# Patient Record
Sex: Female | Born: 1935 | Race: Black or African American | Hispanic: No | Smoking: Never smoker
Health system: Southern US, Community
[De-identification: ages and names within clinical notes are randomized; demographics above are authoritative.]

## PROBLEM LIST (undated history)

## (undated) DIAGNOSIS — R634 Abnormal weight loss: Secondary | ICD-10-CM

## (undated) DIAGNOSIS — Z13220 Encounter for screening for lipoid disorders: Secondary | ICD-10-CM

## (undated) DIAGNOSIS — M810 Age-related osteoporosis without current pathological fracture: Secondary | ICD-10-CM

## (undated) DIAGNOSIS — F0390 Unspecified dementia without behavioral disturbance: Secondary | ICD-10-CM

## (undated) DIAGNOSIS — I10 Essential (primary) hypertension: Secondary | ICD-10-CM

## (undated) DIAGNOSIS — I679 Cerebrovascular disease, unspecified: Secondary | ICD-10-CM

## (undated) DIAGNOSIS — Z0000 Encounter for general adult medical examination without abnormal findings: Secondary | ICD-10-CM

## (undated) DIAGNOSIS — E079 Disorder of thyroid, unspecified: Secondary | ICD-10-CM

## (undated) DIAGNOSIS — K2100 Gastro-esophageal reflux disease with esophagitis, without bleeding: Secondary | ICD-10-CM

## (undated) DIAGNOSIS — K649 Unspecified hemorrhoids: Secondary | ICD-10-CM

## (undated) DIAGNOSIS — G309 Alzheimer's disease, unspecified: Secondary | ICD-10-CM

## (undated) DIAGNOSIS — R413 Other amnesia: Secondary | ICD-10-CM

## (undated) DIAGNOSIS — C73 Malignant neoplasm of thyroid gland: Secondary | ICD-10-CM

## (undated) DIAGNOSIS — D126 Benign neoplasm of colon, unspecified: Secondary | ICD-10-CM

## (undated) DIAGNOSIS — E039 Hypothyroidism, unspecified: Secondary | ICD-10-CM

## (undated) DIAGNOSIS — N3941 Urge incontinence: Secondary | ICD-10-CM

## (undated) DIAGNOSIS — K5900 Constipation, unspecified: Secondary | ICD-10-CM

## (undated) DIAGNOSIS — E46 Unspecified protein-calorie malnutrition: Secondary | ICD-10-CM

## (undated) DIAGNOSIS — F0280 Dementia in other diseases classified elsewhere without behavioral disturbance: Secondary | ICD-10-CM

## (undated) DIAGNOSIS — H269 Unspecified cataract: Secondary | ICD-10-CM

## (undated) DIAGNOSIS — H409 Unspecified glaucoma: Secondary | ICD-10-CM

## (undated) HISTORY — DX: Unspecified cataract: H26.9

## (undated) HISTORY — PX: OTHER SURGICAL HISTORY: SHX169

## (undated) HISTORY — DX: Abnormal weight loss: R63.4

## (undated) HISTORY — DX: Encounter for general adult medical examination without abnormal findings: Z00.00

## (undated) HISTORY — DX: Unspecified glaucoma: H40.9

## (undated) HISTORY — DX: Other amnesia: R41.3

## (undated) HISTORY — DX: Essential (primary) hypertension: I10

## (undated) HISTORY — DX: Cerebrovascular disease, unspecified: I67.9

## (undated) HISTORY — DX: Hypothyroidism, unspecified: E03.9

## (undated) HISTORY — DX: Unspecified hemorrhoids: K64.9

## (undated) HISTORY — DX: Constipation, unspecified: K59.00

## (undated) HISTORY — DX: Age-related osteoporosis without current pathological fracture: M81.0

## (undated) HISTORY — PX: THYROIDECTOMY: SHX17

## (undated) HISTORY — DX: Malignant neoplasm of thyroid gland: C73

## (undated) HISTORY — DX: Dementia in other diseases classified elsewhere, unspecified severity, without behavioral disturbance, psychotic disturbance, mood disturbance, and anxiety: F02.80

## (undated) HISTORY — PX: EYE SURGERY: SHX253

## (undated) HISTORY — DX: Benign neoplasm of colon, unspecified: D12.6

## (undated) HISTORY — DX: Gastro-esophageal reflux disease with esophagitis, without bleeding: K21.00

## (undated) HISTORY — DX: Encounter for screening for lipoid disorders: Z13.220

## (undated) HISTORY — DX: Urge incontinence: N39.41

## (undated) HISTORY — DX: Unspecified protein-calorie malnutrition: E46

## (undated) HISTORY — DX: Gastro-esophageal reflux disease with esophagitis: K21.0

## (undated) HISTORY — PX: NO PAST SURGERIES: SHX2092

## (undated) HISTORY — DX: Alzheimer's disease, unspecified: G30.9

---

## 1997-08-19 ENCOUNTER — Other Ambulatory Visit: Admission: RE | Admit: 1997-08-19 | Discharge: 1997-08-19 | Payer: Self-pay | Admitting: Family Medicine

## 1999-02-25 ENCOUNTER — Encounter: Payer: Self-pay | Admitting: Gastroenterology

## 1999-02-25 ENCOUNTER — Ambulatory Visit (HOSPITAL_COMMUNITY): Admission: RE | Admit: 1999-02-25 | Discharge: 1999-02-25 | Payer: Self-pay | Admitting: Gastroenterology

## 1999-05-12 ENCOUNTER — Other Ambulatory Visit: Admission: RE | Admit: 1999-05-12 | Discharge: 1999-05-12 | Payer: Self-pay | Admitting: Obstetrics and Gynecology

## 2005-02-07 ENCOUNTER — Emergency Department (HOSPITAL_COMMUNITY): Admission: EM | Admit: 2005-02-07 | Discharge: 2005-02-08 | Payer: Self-pay | Admitting: Emergency Medicine

## 2010-11-16 ENCOUNTER — Other Ambulatory Visit: Payer: Self-pay | Admitting: Internal Medicine

## 2010-11-16 DIAGNOSIS — Z780 Asymptomatic menopausal state: Secondary | ICD-10-CM

## 2010-11-16 DIAGNOSIS — Z1231 Encounter for screening mammogram for malignant neoplasm of breast: Secondary | ICD-10-CM

## 2010-11-17 ENCOUNTER — Ambulatory Visit
Admission: RE | Admit: 2010-11-17 | Discharge: 2010-11-17 | Disposition: A | Discharge status: Home / Self Care | Payer: Medicare Other | Source: Ambulatory Visit | Attending: Internal Medicine | Admitting: Internal Medicine

## 2010-11-17 DIAGNOSIS — Z1231 Encounter for screening mammogram for malignant neoplasm of breast: Secondary | ICD-10-CM

## 2010-11-17 DIAGNOSIS — Z780 Asymptomatic menopausal state: Secondary | ICD-10-CM

## 2011-09-24 ENCOUNTER — Observation Stay (HOSPITAL_COMMUNITY)
Admission: EM | Admit: 2011-09-24 | Discharge: 2011-09-26 | Disposition: A | Discharge status: Home / Self Care | Payer: Medicare Other | Attending: Internal Medicine | Admitting: Internal Medicine

## 2011-09-24 ENCOUNTER — Encounter (HOSPITAL_COMMUNITY): Payer: Self-pay | Admitting: Emergency Medicine

## 2011-09-24 DIAGNOSIS — F0390 Unspecified dementia without behavioral disturbance: Secondary | ICD-10-CM | POA: Diagnosis present

## 2011-09-24 DIAGNOSIS — R1032 Left lower quadrant pain: Secondary | ICD-10-CM | POA: Diagnosis present

## 2011-09-24 DIAGNOSIS — K649 Unspecified hemorrhoids: Secondary | ICD-10-CM | POA: Insufficient documentation

## 2011-09-24 DIAGNOSIS — K922 Gastrointestinal hemorrhage, unspecified: Principal | ICD-10-CM | POA: Diagnosis present

## 2011-09-24 DIAGNOSIS — E785 Hyperlipidemia, unspecified: Secondary | ICD-10-CM | POA: Diagnosis present

## 2011-09-24 DIAGNOSIS — D62 Acute posthemorrhagic anemia: Secondary | ICD-10-CM | POA: Insufficient documentation

## 2011-09-24 DIAGNOSIS — K5730 Diverticulosis of large intestine without perforation or abscess without bleeding: Secondary | ICD-10-CM | POA: Insufficient documentation

## 2011-09-24 HISTORY — DX: Essential (primary) hypertension: I10

## 2011-09-24 HISTORY — DX: Unspecified dementia, unspecified severity, without behavioral disturbance, psychotic disturbance, mood disturbance, and anxiety: F03.90

## 2011-09-24 HISTORY — DX: Disorder of thyroid, unspecified: E07.9

## 2011-09-24 NOTE — ED Notes (Signed)
Pt states this morning early after going to bathroom noted water to be real dark blood and blood clots in water. [place] daughter took to urgent care 2  Hours ago who would not see her. Pt states she has had 3 more bloody, a brighter color of blood. Pt denies hx of rectal bleeding of this nature. Hx of hemoroids w/ blood on tissue only but states this is different. VS stable, pt's color appears pale and washed out.

## 2011-09-25 ENCOUNTER — Inpatient Hospital Stay (HOSPITAL_COMMUNITY): Payer: Medicare Other

## 2011-09-25 ENCOUNTER — Encounter (HOSPITAL_COMMUNITY): Payer: Self-pay | Admitting: *Deleted

## 2011-09-25 DIAGNOSIS — R1032 Left lower quadrant pain: Secondary | ICD-10-CM | POA: Diagnosis present

## 2011-09-25 DIAGNOSIS — K922 Gastrointestinal hemorrhage, unspecified: Secondary | ICD-10-CM | POA: Diagnosis present

## 2011-09-25 DIAGNOSIS — E785 Hyperlipidemia, unspecified: Secondary | ICD-10-CM | POA: Diagnosis present

## 2011-09-25 DIAGNOSIS — F0390 Unspecified dementia without behavioral disturbance: Secondary | ICD-10-CM | POA: Diagnosis present

## 2011-09-25 LAB — CBC WITH DIFFERENTIAL/PLATELET
Basophils Absolute: 0 10*3/uL (ref 0.0–0.1)
HCT: 33.7 % — ABNORMAL LOW (ref 36.0–46.0)
Hemoglobin: 11.3 g/dL — ABNORMAL LOW (ref 12.0–15.0)
Lymphocytes Relative: 25 % (ref 12–46)
Lymphs Abs: 1.1 10*3/uL (ref 0.7–4.0)
Monocytes Relative: 9 % (ref 3–12)
Monocytes Relative: 10 % (ref 3–12)
Neutro Abs: 3.5 10*3/uL (ref 1.7–7.7)
Neutrophils Relative %: 64 % (ref 43–77)
Neutrophils Relative %: 67 % (ref 43–77)
Platelets: 165 10*3/uL (ref 150–400)
RBC: 3.84 MIL/uL — ABNORMAL LOW (ref 3.87–5.11)
RDW: 13.3 % (ref 11.5–15.5)
WBC: 5.5 10*3/uL (ref 4.0–10.5)
WBC: 5.2 10*3/uL (ref 4.0–10.5)

## 2011-09-25 LAB — COMPREHENSIVE METABOLIC PANEL
ALT: 6 U/L (ref 0–35)
Albumin: 3 g/dL — ABNORMAL LOW (ref 3.5–5.2)
Alkaline Phosphatase: 66 U/L (ref 39–117)
BUN: 20 mg/dL (ref 6–23)
Chloride: 104 mEq/L (ref 96–112)
GFR calc Af Amer: 57 mL/min — ABNORMAL LOW (ref >90)
Glucose, Bld: 66 mg/dL — ABNORMAL LOW (ref 70–99)
Potassium: 3.5 mEq/L (ref 3.5–5.1)
Sodium: 142 mEq/L (ref 135–145)
Total Bilirubin: 0.6 mg/dL (ref 0.3–1.2)

## 2011-09-25 LAB — HEMOGLOBIN AND HEMATOCRIT, BLOOD: HCT: 35.9 % — ABNORMAL LOW (ref 36.0–46.0)

## 2011-09-25 LAB — ABO/RH: ABO/RH(D): O POS

## 2011-09-25 LAB — MAGNESIUM: Magnesium: 1.9 mg/dL (ref 1.5–2.5)

## 2011-09-25 LAB — TYPE AND SCREEN: ABO/RH(D): O POS

## 2011-09-25 MED ORDER — ZOLPIDEM TARTRATE 5 MG PO TABS: 5.0000 mg | ORAL_TABLET | Freq: Every evening | ORAL | Status: DC | PRN

## 2011-09-25 MED ORDER — ACETAMINOPHEN 650 MG RE SUPP: 650.0000 mg | Freq: Four times a day (QID) | RECTAL | Status: DC | PRN

## 2011-09-25 MED ORDER — SODIUM CHLORIDE 0.9 % IV SOLN: 250.0000 mL | INTRAVENOUS | Status: DC | PRN

## 2011-09-25 MED ORDER — LEVOTHYROXINE SODIUM 100 MCG PO TABS
100.0000 ug | ORAL_TABLET | Freq: Every day | ORAL | Status: DC
  Administered 2011-09-25 – 2011-09-26 (×2): 100 ug via ORAL
  Filled 2011-09-25 (×3): qty 1

## 2011-09-25 MED ORDER — ONDANSETRON HCL 4 MG/2ML IJ SOLN: 4.0000 mg | Freq: Four times a day (QID) | INTRAMUSCULAR | Status: DC | PRN

## 2011-09-25 MED ORDER — SODIUM CHLORIDE 0.9 % IJ SOLN: 3.0000 mL | Freq: Two times a day (BID) | INTRAMUSCULAR | Status: DC

## 2011-09-25 MED ORDER — ACETAMINOPHEN 325 MG PO TABS: 650.0000 mg | ORAL_TABLET | Freq: Four times a day (QID) | ORAL | Status: DC | PRN

## 2011-09-25 MED ORDER — SODIUM CHLORIDE 0.9 % IJ SOLN: 3.0000 mL | INTRAMUSCULAR | Status: DC | PRN

## 2011-09-25 MED ORDER — SODIUM CHLORIDE 0.9 % IV SOLN
INTRAVENOUS | Status: AC
  Administered 2011-09-25: 05:00:00 via INTRAVENOUS

## 2011-09-25 MED ORDER — ONDANSETRON HCL 4 MG PO TABS: 4.0000 mg | ORAL_TABLET | Freq: Four times a day (QID) | ORAL | Status: DC | PRN

## 2011-09-25 NOTE — Consult Note (Signed)
Subjective:   HPI  The patient is a 78 year old female who we are asked to see in consultation in regards to rectal bleeding. She has noticed some intermittent rectal bleeding over the last week. She has not seen any today. She has remained hemodynamically stable. She describes the blood as bright red. She has a history of hemorrhoids. She had a colonoscopy 6 months ago by Dr. Ferdinand Lango in [place] [facility] and was told then she had hemorrhoids and never had to have another colonoscopy. I asked the patient and family if she ever had been told that she had diverticulosis and they did not recall this. She is not complaining of abdominal pain. She denies diarrhea. She had a CT scan of the abdomen and pelvis last night which shows numerous diverticula in the colon. There are no other findings in the colon to explain bleeding. There is also free fluid noted in the abdomen on the CT of uncertain etiology.  Review of Systems Denies chest pain or shortness of breath.  Past Medical History  Diagnosis Date  . Hypertension   . Thyroid disease    Past Surgical History  Procedure Date  . Thyroidectomy    History   Social History  . Marital Status: Widowed    Spouse Name: N/A    Number of Children: N/A  . Years of Education: N/A   Occupational History  . Not on file.   Social History Main Topics  . Smoking status: Never Smoker   . Smokeless tobacco: Not on file  . Alcohol Use: No  . Drug Use: No  . Sexually Active:    Other Topics Concern  . Not on file   Social History Narrative  . No narrative on file   family history is not on file. Current facility-administered medications:0.9 %  sodium chloride infusion, , Intravenous, Continuous, Bynum Bellows, MD, Last Rate: 75 mL/hr at 09/25/11 0458;  0.9 %  sodium chloride infusion, 250 mL, Intravenous, PRN, Simbiso Ranga, MD;  acetaminophen (TYLENOL) tablet 650 mg, 650 mg, Oral, Q6H PRN, Simbiso Ranga, MD;  levothyroxine (SYNTHROID,  LEVOTHROID) tablet 100 mcg, 100 mcg, Oral, Daily, Debbe Odea, MD, 100 mcg at 09/25/11 1120 ondansetron (ZOFRAN) injection 4 mg, 4 mg, Intravenous, Q6H PRN, Simbiso Ranga, MD;  ondansetron (ZOFRAN) tablet 4 mg, 4 mg, Oral, Q6H PRN, Simbiso Ranga, MD;  sodium chloride 0.9 % injection 3 mL, 3 mL, Intravenous, Q12H, Simbiso Ranga, MD;  sodium chloride 0.9 % injection 3 mL, 3 mL, Intravenous, PRN, Simbiso Ranga, MD;  zolpidem (AMBIEN) tablet 5 mg, 5 mg, Oral, QHS PRN, Nat Math, MD DISCONTD: acetaminophen (TYLENOL) suppository 650 mg, 650 mg, Rectal, Q6H PRN, Simbiso Ranga, MD No Known Allergies   Objective:     BP 124/49  Pulse 63  Temp 98.4 F (36.9 C) (Oral)  Resp 18  Ht 5' (1.524 m)  Wt 57.153 kg (126 lb)  BMI 24.61 kg/m2  SpO2 100%  She is in no distress.  Heart regular rhythm.  Lungs clear  Abdomen: Bowel sounds normal, soft, nontender  Laboratory No components found with this basename: d1      Assessment:     #1 rectal bleeding. This sounds to be of small volume. It could be hemorrhoidal or diverticular in nature. She is hemodynamically stable.  #2 free fluid in the abdominal pelvic area of uncertain etiology      Plan:     Obtain report of her recent colonoscopy. I'm not sure that we  need to any further diagnostic studies in regards to the colon as long as she remains hemodynamically stable. We will continue to monitor her clinically Lab Results  Component Value Date   HGB 11.3* 09/25/2011   HGB 11.0* 09/24/2011   HCT 35.0* 09/25/2011   HCT 33.7* 09/24/2011   ALKPHOS 66 09/25/2011   AST 17 09/25/2011   ALT 6 09/25/2011

## 2011-09-25 NOTE — Progress Notes (Signed)
[facility]   PT admitted this AM with c/o bright red blood per rectum. She has a h/o hemorrhoids but this bleed was pure blood and not mixed with stools. Her daughter states that she looked at the patient's depends prior to bringing her to the hospital and it did not seem like a significant amount of blood. Pt insists that is was a lot of blood.  No abd pain. No dizziness. Last colonoscopy less than 1 yr ago by Dr Ferdinand Lango at [facility]. She has had colonoscopies every 5 yrs and was told that this would be her last. No h/o malignant polyps being removed or colon cancer in the family.  Hgb stable- cont to check Q12 for now.  consulted with Dr Penelope Coop who will evaluate her.   Debbe Odea, MD [phone]

## 2011-09-25 NOTE — Progress Notes (Signed)
Patient resting in bed, no complaints of pain.  Offered beverages but declined states has ordered dinner.

## 2011-09-26 ENCOUNTER — Encounter (HOSPITAL_COMMUNITY): Payer: Self-pay | Admitting: *Deleted

## 2011-09-26 ENCOUNTER — Inpatient Hospital Stay (HOSPITAL_COMMUNITY): Payer: Medicare Other

## 2011-09-26 LAB — BASIC METABOLIC PANEL
CO2: 27 mEq/L (ref 19–32)
Calcium: 8.5 mg/dL (ref 8.4–10.5)
Creatinine, Ser: 0.99 mg/dL (ref 0.50–1.10)
GFR calc Af Amer: 56 mL/min — ABNORMAL LOW (ref >90)
Sodium: 144 mEq/L (ref 135–145)

## 2011-09-26 LAB — HEMOGLOBIN AND HEMATOCRIT, BLOOD
HCT: 32.3 % — ABNORMAL LOW (ref 36.0–46.0)
Hemoglobin: 10.3 g/dL — ABNORMAL LOW (ref 12.0–15.0)

## 2011-09-26 MED ORDER — DOCUSATE SODIUM 100 MG PO CAPS: 100.0000 mg | ORAL_CAPSULE | Freq: Two times a day (BID) | ORAL | Status: AC

## 2011-09-26 NOTE — H&P (Addendum)
[facility] History and Physical  Jill Gutierrez [id number] DOB: 09/26/1917 DOA: 09/24/2011  Referring physician: ER physician PCP: No primary provider on file.   Chief Complaint: bright red blood per rectum  HPI:  78 year old female with past medical history of dementia and  Dyslipidemia who presented to ED with complaints of bright red blood per rectum started morning of the admission. Patient reports she has noticed profound amount of blood with every bowel movement. She also reported that she does have previous history of similar event and per her recollection it was due to hemorrhoids. Patient reports associated left lower quadrant abdominal pain, 5/10 in intensity and non radiating. She has not taken anything for pain relief. No associated diarrhea and no blood in urine.No fever or chills.  Assessment and Plan:  Principal Problem: *Lower GI bleed  unclear etiology at this time  Please note that due to [facility] system downtime labs and imaging studies were not available  Were awaiting CBC and CT abdomen/pelvis  Active problems: Acute blood loss anemia  CBC was not available at the time of admission but due to reported BRBPR patient was admitted for further observation  CBC then became available with hemoglobin at 11 and patient did not require transfusion at the time of admission  Left lower quadrant abdominal pain  CT abdomen /pelvis was again unavailable at the time of admission but the findings now reported below  Code Status: Full Family Communication: Pt and family updated at bedside Disposition Plan: Admit for further evaluation  Leisa Lenz, MD  [facility] Pager [phone]  If 7PM-7AM, please contact night-coverage [email or web address] Password [email or web address] 09/26/2011, 8:50 PM   Review of Systems:  Constitutional: Negative for fever, chills and malaise/fatigue. Negative for diaphoresis.  HENT: Negative for hearing loss, ear pain, nosebleeds,  congestion, sore throat, neck pain, tinnitus and ear discharge.   Eyes: Negative for blurred vision, double vision, photophobia, pain, discharge and redness.  Respiratory: Negative for cough, hemoptysis, sputum production, shortness of breath, wheezing and stridor.   Cardiovascular: Negative for chest pain, palpitations, orthopnea, claudication and leg swelling.  Gastrointestinal: per GI Genitourinary: Negative for dysuria, urgency, frequency, hematuria and flank pain.  Musculoskeletal: Negative for myalgias, back pain, joint pain and falls.  Skin: Negative for itching and rash.  Neurological: Negative for dizziness and weakness. Negative for tingling, tremors, sensory change, speech change, focal weakness, loss of consciousness and headaches.  Endo/Heme/Allergies: Negative for environmental allergies and polydipsia. Does not bruise/bleed easily.  Psychiatric/Behavioral: Negative for suicidal ideas. The patient is not nervous/anxious.      Past Medical History  Diagnosis Date  . Hypertension   . Thyroid disease   . Dementia    Past Surgical History  Procedure Date  . Thyroidectomy   . No past surgeries    Social History:  reports that she has never smoked. She does not have any smokeless tobacco history on file. She reports that she does not drink alcohol or use illicit drugs.  No Known Allergies  Family History: no history of cancers, no cardiovascular diseases on mother or father side  Prior to Admission medications   Medication Sig Start Date End Date Taking? Authorizing Provider  cholecalciferol (VITAMIN D) 1000 UNITS tablet Take 1,000 Units by mouth daily.   Yes Historical Provider, MD  hydrochlorothiazide (HYDRODIURIL) 25 MG tablet Take 25 mg by mouth daily.   Yes Historical Provider, MD  levothyroxine (SYNTHROID, LEVOTHROID) 100 MCG tablet Take 100 mcg by mouth daily.  Yes Historical Provider, MD  lovastatin (MEVACOR) 20 MG tablet Take 20 mg by mouth at bedtime.   Yes  Historical Provider, MD  potassium chloride (KLOR-CON) 8 MEQ tablet Take 8 mEq by mouth 2 (two) times daily.   Yes Historical Provider, MD  docusate sodium (COLACE) 100 MG capsule Take 1 capsule (100 mg total) by mouth 2 (two) times daily. 09/26/11 10/06/11  Nat Math, MD   Physical Exam: Filed Vitals:   09/25/11 1107 09/25/11 1345 09/25/11 2100 09/26/11 0736  BP: 124/49 116/81 104/61 131/63  Pulse: 63 56 57 54  Temp:  98 F (36.7 C) 97.8 F (36.6 C) 97.2 F (36.2 C)  TempSrc:  Oral Oral Oral  Resp:  18 18 18   Height:      Weight:      SpO2:  100% 98% 100%    Physical Exam  Constitutional: Appears well-developed and well-nourished. No distress.  HENT: Normocephalic. External right and left ear normal. Oropharynx is clear and moist.  Eyes: Conjunctivae and EOM are normal. PERRLA, no scleral icterus.  Neck: Normal ROM. Neck supple. No JVD. No tracheal deviation. No thyromegaly.  CVS: RRR, S1/S2 +, no murmurs, no gallops, no carotid bruit.  Pulmonary: Effort and breath sounds normal, no stridor, rhonchi, wheezes, rales.  Abdominal: Soft. BS +,  no distension, tenderness, rebound or guarding.  Musculoskeletal: Normal range of motion. No edema and no tenderness.  Lymphadenopathy: No lymphadenopathy noted, cervical, inguinal. Neuro: Alert. Normal reflexes, muscle tone coordination. No cranial nerve deficit. Skin: Skin is warm and dry. No rash noted. Not diaphoretic. No erythema. No pallor.  Psychiatric: Normal mood and affect. Behavior, judgment, thought content normal.   Labs on Admission:  Basic Metabolic Panel:  Lab [id number] 09/25/11 0514  NA 144 142  K 3.5 3.5  CL 108 104  CO2 27 27  GLUCOSE 99 66*  BUN 16 20  CREATININE 0.99 0.97  CALCIUM 8.5 9.1  MG -- 1.9  PHOS -- 3.1   Liver Function Tests:  Lab 09/25/11 0514  AST 17  ALT 6  ALKPHOS 66  BILITOT 0.6  PROT 6.3  ALBUMIN 3.0*   CBC:  Lab 09/26/11 0335 09/25/11 1530 09/25/11 0514 09/24/11 2028  WBC --  -- 5.2 5.5  NEUTROABS -- -- 3.5 3.5  HGB 10.3* 11.7* 11.3* 11.0*  HCT 32.3* 35.9* 35.0* 33.7*  MCV -- -- 91.1 91.6  PLT -- -- 129* 165   CBG:  Lab 09/26/11 0739 09/25/11 0725  GLUCAP 121* 54*    Radiological Exams on Admission: Ct Abdomen Pelvis Wo Contrast  09/25/2011  *RADIOLOGY REPORT*  Clinical Data: Left lower quadrant pain  CT ABDOMEN AND PELVIS WITHOUT CONTRAST  Technique:  Multidetector CT imaging of the abdomen and pelvis was performed following the standard protocol without intravenous contrast.  Comparison: None.  Findings: Patchy density is partially visualized in the left base. No pericardial or pleural effusion.  Cyst is noted within the dome of the liver.  There is a calcified granuloma in the right hepatic lobe.  Prior cholecystectomy.  No biliary dilatation.  The pancreas is unremarkable.  Normal appearance of the spleen.  Both adrenal glands are unremarkable.  Normal appearance of the kidneys.  No hydronephrosis or nephrolithiasis.  There is mild wall thickening involving the urinary bladder.  There is a small soft tissue attenuating nodule at the bladder base measuring 0.7 cm, image 69. The uterus and the adnexal structures have a normal physiologic appearance for patient's age.  A small amount of free fluid is present within the dependent portion of the pelvis.  No enlarged upper abdominal or pelvic lymph nodes.  No inguinal adenopathy.  The stomach and the small bowel loops have a normal course and caliber without evidence for bowel obstruction.  There is extensive diverticulosis involving the sigmoid colon.  No acute inflammation.  Advanced calcified atherosclerotic disease affects the abdominal aorta and its branches.  Review of the visualized osseous structures is significant for mild multilevel spondylosis.  No acute findings identified.  IMPRESSION:  1.  Patchy density in the left base may represent early or resolving pneumonia.  Consider further evaluation with chest  radiograph. 2.  Small amount of free fluid within the dependent portion of the pelvis is of uncertain etiology. 3.  Small soft tissue attenuating nodule at the bladder base measures 7 mm.  Nonspecific.  Cannot rule out small neoplasm.  Original Report Authenticated By: Angelita Ingles, M.D.   Dg Chest Port 1 View  09/26/2011  *RADIOLOGY REPORT*  Clinical Data: Rule out pneumonia  PORTABLE CHEST - 1 VIEW  Comparison: None.  Findings: Lungs are clear.  Negative for pneumonia.  Negative for heart failure or effusion.  Degenerative changes in the shoulder joint and AC joint bilaterally.  IMPRESSION: No active cardiopulmonary disease.  Original Report Authenticated By: Truett Perna, M.D.    EKG: Normal sinus rhythm, no ST/T wave changes    Time spent: 75 minutes

## 2011-09-26 NOTE — Discharge Summary (Signed)
DISCHARGE SUMMARY  Jill Gutierrez  MR#: [id number]  DOB:Mar 16, 1917  Date of Admission: 09/24/2011 Date of Discharge: 09/26/2011  Attending Physician:Wei Newbrough  Patient's PCP:No primary provider on file.  Consults: [place], [place],MD  Discharge Diagnoses: Present on Admission:  .Lower GI bleed diverticular versus hemorrhoidal. .Abdominal pain, left lower quadrant .Dementia .Dyslipidemia   Hospital Course: Jill Gutierrez was admitted with LLQ pain , and rectal bleeding. The bleeding was self limiting and felt to be due to diverticular bleed. She was seen by gi, who opined no endoscopy. A ct abdomen/pelvis suggested some non specific fluid in the pelvis. Jill Gutierrez is hemodynamically stable, and will d/c home to the care of Jill Gutierrez family today. i have encouraged Jill Gutierrez to avoid aspirin for now.   Medication List  As of 09/26/2011  2:28 PM   TAKE these medications         cholecalciferol 1000 UNITS tablet   Commonly known as: VITAMIN D   Take 1,000 Units by mouth daily.      docusate sodium 100 MG capsule   Commonly known as: COLACE   Take 1 capsule (100 mg total) by mouth 2 (two) times daily.      hydrochlorothiazide 25 MG tablet   Commonly known as: HYDRODIURIL   Take 25 mg by mouth daily.      levothyroxine 100 MCG tablet   Commonly known as: SYNTHROID, LEVOTHROID   Take 100 mcg by mouth daily.      lovastatin 20 MG tablet   Commonly known as: MEVACOR   Take 20 mg by mouth at bedtime.      potassium chloride 8 MEQ tablet   Commonly known as: KLOR-CON   Take 8 mEq by mouth 2 (two) times daily.             Day of Discharge BP 131/63  Pulse 54  Temp 97.2 F (36.2 C) (Oral)  Resp 18  Ht 5' (1.524 m)  Wt 57.153 kg (126 lb)  BMI 24.61 kg/m2  SpO2 100%  Physical Exam: At baseline.  Results for orders placed during the hospital encounter of 09/24/11 (from the past 24 hour(s))  HEMOGLOBIN AND HEMATOCRIT, BLOOD     Status: Abnormal   Collection Time   09/25/11  3:30 PM      Component Value Range   Hemoglobin 11.7 (*) 12.0 - 15.0 g/dL   HCT 35.9 (*) 36.0 - 46.0 %  HEMOGLOBIN AND HEMATOCRIT, BLOOD     Status: Abnormal   Collection Time   09/26/11  3:35 AM      Component Value Range   Hemoglobin 10.3 (*) 12.0 - 15.0 g/dL   HCT 32.3 (*) 36.0 - [id number] %  BASIC METABOLIC PANEL     Status: Abnormal   Collection Time   09/26/11  3:35 AM      Component Value Range   Sodium 144  135 - 145 mEq/L   Potassium 3.5  3.5 - 5.1 mEq/L   Chloride 108  96 - 112 mEq/L   CO2 27  19 - 32 mEq/L   Glucose, Bld 99  70 - 99 mg/dL   BUN 16  6 - 23 mg/dL   Creatinine, Ser 0.99  0.50 - 1.10 mg/dL   Calcium 8.5  8.4 - 10.5 mg/dL   GFR calc non Af Amer 48 (*) >90 mL/min   GFR calc Af Amer 56 (*) >90 mL/min  GLUCOSE, CAPILLARY     Status: Abnormal   Collection Time   09/26/11  7:39 AM      Component Value Range   Glucose-Capillary 121 (*) 70 - 99 mg/dL   Comment 1 Notify RN      Disposition: home today.   Follow-up Appts: Discharge Orders    Future Orders Please Complete By Expires   Diet general      Increase activity slowly           Time spent in discharge (includes decision making & examination of pt): 20 minutes  Signed: Helma Argyle 09/26/2011, 2:28 PM

## 2011-09-26 NOTE — Progress Notes (Signed)
Pt was discharged and granddaughter took her home. D/c instructions (AVS) was provided and patient verbalized understanding of d/c instructions.

## 2011-09-26 NOTE — Progress Notes (Signed)
[facility] Progress Note  Subjective: No further bleeding. Feels good. CT shows extensive diverticulosis. Last colonoscopy was 6 months ago per patient.  Objective: Vital signs in last 24 hours: Temp:  [97.2 F (36.2 C)-98 F (36.7 C)] 97.2 F (36.2 C) (07/23 0736) Pulse Rate:  [54-63] 54  (07/23 0736) Resp:  [18] 18  (07/23 0736) BP: (104-131)/(49-81) 131/63 mmHg (07/23 0736) SpO2:  [98 %-100 %] 100 % (07/23 0736) Weight change:    PE: No distress        Abdomen soft, non tender.  Lab Results: Results for orders placed during the hospital encounter of 09/24/11 (from the past 24 hour(s))  HEMOGLOBIN AND HEMATOCRIT, BLOOD     Status: Abnormal   Collection Time   09/25/11  3:30 PM      Component Value Range   Hemoglobin 11.7 (*) 12.0 - 15.0 g/dL   HCT 35.9 (*) 36.0 - 46.0 %  HEMOGLOBIN AND HEMATOCRIT, BLOOD     Status: Abnormal   Collection Time   09/26/11  3:35 AM      Component Value Range   Hemoglobin 10.3 (*) 12.0 - 15.0 g/dL   HCT 32.3 (*) 36.0 - [id number] %  BASIC METABOLIC PANEL     Status: Abnormal   Collection Time   09/26/11  3:35 AM      Component Value Range   Sodium 144  135 - 145 mEq/L   Potassium 3.5  3.5 - 5.1 mEq/L   Chloride 108  96 - 112 mEq/L   CO2 27  19 - 32 mEq/L   Glucose, Bld 99  70 - 99 mg/dL   BUN 16  6 - 23 mg/dL   Creatinine, Ser 0.99  0.50 - 1.10 mg/dL   Calcium 8.5  8.4 - 10.5 mg/dL   GFR calc non Af Amer 48 (*) >90 mL/min   GFR calc Af Amer 56 (*) >90 mL/min  GLUCOSE, CAPILLARY     Status: Abnormal   Collection Time   09/26/11  7:39 AM      Component Value Range   Glucose-Capillary 121 (*) 70 - 99 mg/dL   Comment 1 Notify RN      Studies/Results: @[id number] @    Assessment: LGI bleed due to diverticulosis. Appears to have stopped.   Other findings on CT scan noted could be followed up as outpatient with her PCP with appropriate referral.  Plan: Probably could go home. Follow up with her PCP.    Wonda Horner 09/26/2011, 8:45 AM  Lab Results  Component Value Date   HGB 10.3* 09/26/2011   HGB 11.7* 09/25/2011   HGB 11.3* 09/25/2011   HCT 32.3* 09/26/2011   HCT 35.9* 09/25/2011   HCT 35.0* 09/25/2011   ALKPHOS 66 09/25/2011   AST 17 09/25/2011   ALT 6 09/25/2011

## 2012-05-31 ENCOUNTER — Encounter: Payer: Self-pay | Admitting: *Deleted

## 2012-06-03 ENCOUNTER — Ambulatory Visit (INDEPENDENT_AMBULATORY_CARE_PROVIDER_SITE_OTHER): Payer: Medicare Other | Admitting: Internal Medicine

## 2012-06-03 ENCOUNTER — Encounter: Payer: Self-pay | Admitting: Internal Medicine

## 2012-06-03 VITALS — BP 130/72 | HR 67 | Temp 97.8°F | Resp 14 | Wt 130.0 lb

## 2012-06-03 DIAGNOSIS — R3981 Functional urinary incontinence: Secondary | ICD-10-CM

## 2012-06-03 DIAGNOSIS — M1990 Unspecified osteoarthritis, unspecified site: Secondary | ICD-10-CM

## 2012-06-03 DIAGNOSIS — F0150 Vascular dementia without behavioral disturbance: Secondary | ICD-10-CM

## 2012-06-03 DIAGNOSIS — E039 Hypothyroidism, unspecified: Secondary | ICD-10-CM | POA: Insufficient documentation

## 2012-06-03 DIAGNOSIS — M810 Age-related osteoporosis without current pathological fracture: Secondary | ICD-10-CM

## 2012-06-03 DIAGNOSIS — E559 Vitamin D deficiency, unspecified: Secondary | ICD-10-CM

## 2012-06-03 DIAGNOSIS — E785 Hyperlipidemia, unspecified: Secondary | ICD-10-CM | POA: Insufficient documentation

## 2012-06-03 DIAGNOSIS — I10 Essential (primary) hypertension: Secondary | ICD-10-CM

## 2012-06-03 DIAGNOSIS — H409 Unspecified glaucoma: Secondary | ICD-10-CM

## 2012-06-03 HISTORY — DX: Hypothyroidism, unspecified: E03.9

## 2012-06-03 MED ORDER — DICLOFENAC SODIUM 1 % TD GEL: TRANSDERMAL | Status: DC

## 2012-06-03 MED ORDER — VITAMIN D3 50 MCG (2000 UT) PO CAPS: 2000.0000 [IU] | ORAL_CAPSULE | Freq: Every day | ORAL | Status: DC

## 2012-06-03 NOTE — Assessment & Plan Note (Addendum)
Having urge incontinence first thing in the morning.  Has 2-3 episodes and then normalizes into the day.  Takes her medicines after breakfast.  Hasn't taken any of her medicines yet today.  Will r/o UTI with UA c+s.

## 2012-06-03 NOTE — Assessment & Plan Note (Signed)
Is to be taking vitamin D but they never filled the prescription.  Is on alendronate weekly and is taking it per daughter.  No recent falls.

## 2012-06-03 NOTE — Assessment & Plan Note (Signed)
Continues to be poorly adherent with medicines.  Is on namenda.

## 2012-06-03 NOTE — Assessment & Plan Note (Signed)
Will f/u flp before next visit when fasting.  Cont lovastatin.

## 2012-06-03 NOTE — Assessment & Plan Note (Signed)
On synthroid.  Will f/u tsh.  Pt does not always show up for appts.  Here with a different daughter today.  Pt not adherent with medicines, but insists on taking them herself.

## 2012-06-04 LAB — BASIC METABOLIC PANEL
BUN/Creatinine Ratio: 16 (ref 11–26)
BUN: 15 mg/dL (ref 10–36)
CO2: 29 mmol/L — ABNORMAL HIGH (ref 19–28)
Calcium: 8.8 mg/dL (ref 8.6–10.2)
Chloride: 102 mmol/L (ref 97–108)
Creatinine, Ser: 0.93 mg/dL (ref 0.57–1.00)
GFR calc Af Amer: 62 mL/min/{1.73_m2} (ref >59)
GFR calc non Af Amer: 54 mL/min/{1.73_m2} — ABNORMAL LOW (ref >59)
Glucose: 81 mg/dL (ref 65–99)
Potassium: 4 mmol/L (ref 3.5–5.2)
Sodium: 144 mmol/L (ref 134–144)

## 2012-06-04 LAB — URINALYSIS, ROUTINE W REFLEX MICROSCOPIC
Bilirubin, UA: NEGATIVE
Glucose, UA: NEGATIVE
Ketones, UA: NEGATIVE
Nitrite, UA: POSITIVE — AB
Specific Gravity, UA: 1.018 (ref 1.005–1.030)
Urobilinogen, Ur: 0.2 mg/dL (ref 0.0–1.9)
pH, UA: 6.5 (ref 5.0–7.5)

## 2012-06-04 LAB — MICROSCOPIC EXAMINATION: WBC, UA: >30 /hpf — AB (ref 0–?)

## 2012-06-04 LAB — TSH: TSH: 0.345 u[IU]/mL — ABNORMAL LOW (ref 0.450–4.500)

## 2012-06-05 LAB — URINE CULTURE

## 2012-06-06 ENCOUNTER — Other Ambulatory Visit: Payer: Self-pay | Admitting: Geriatric Medicine

## 2012-06-06 DIAGNOSIS — E039 Hypothyroidism, unspecified: Secondary | ICD-10-CM

## 2012-06-06 DIAGNOSIS — I10 Essential (primary) hypertension: Secondary | ICD-10-CM

## 2012-06-06 DIAGNOSIS — F0150 Vascular dementia without behavioral disturbance: Secondary | ICD-10-CM

## 2012-06-06 DIAGNOSIS — E785 Hyperlipidemia, unspecified: Secondary | ICD-10-CM

## 2012-06-06 DIAGNOSIS — M1990 Unspecified osteoarthritis, unspecified site: Secondary | ICD-10-CM

## 2012-06-06 DIAGNOSIS — R3981 Functional urinary incontinence: Secondary | ICD-10-CM

## 2012-06-06 DIAGNOSIS — H409 Unspecified glaucoma: Secondary | ICD-10-CM

## 2012-06-06 DIAGNOSIS — E559 Vitamin D deficiency, unspecified: Secondary | ICD-10-CM

## 2012-06-06 DIAGNOSIS — M810 Age-related osteoporosis without current pathological fracture: Secondary | ICD-10-CM

## 2012-06-06 MED ORDER — CIPROFLOXACIN HCL 500 MG PO TABS: ORAL_TABLET | ORAL | Status: DC

## 2012-06-06 MED ORDER — DICLOFENAC SODIUM 1 % TD GEL: TRANSDERMAL | Status: DC

## 2012-06-06 NOTE — Progress Notes (Signed)
Patient ID: Jill Gutierrez, female   DOB: Nov 09, 1917, 79 y.o.   MRN: [id number]  79 yo female with dementia who lives at home and has care provided by her daughters presents today with concerns of increased urinary incontinence.  She notes this is worse in the mornings and improves through the day.  She does not take her diuretic until after this (when she takes it at all).  She does not take her medicines much of the time and her daughters put them out but still do not administer them to her.  I have advised this in the past.  Pt is insistent upon taking her own medicines, but lacks the judgment to make this decision.  Review of Systems  Constitutional: Negative for fever and chills.  Respiratory: Negative for cough and shortness of breath.   Cardiovascular: Negative for chest pain and leg swelling.  Gastrointestinal: Negative for nausea, vomiting and abdominal pain.  Genitourinary: Positive for urgency and frequency. Negative for dysuria, hematuria and flank pain.       + incontinence as in hpi  Musculoskeletal: Negative for myalgias.       Chronic pain in feet and legs.  Has b/l foot drop  Skin: Negative for rash.  Neurological: Positive for sensory change. Negative for loss of consciousness.  Endo/Heme/Allergies: Does not bruise/bleed easily.  Psychiatric/Behavioral: Positive for memory loss.   Physical Exam  Constitutional: No distress.  Thin AA female  HENT:  Head: Normocephalic and atraumatic.  Cardiovascular: Normal rate, regular rhythm and normal heart sounds.   Pulmonary/Chest: Effort normal and breath sounds normal.  Abdominal: Soft. Bowel sounds are normal. She exhibits no distension and no mass. There is no tenderness.  Genitourinary:  No suprapubic tenderness  Neurological: She is alert. No cranial nerve deficit.  Oriented to person and place not to time Has b/l foot drop  Skin: Skin is warm and dry.  Psychiatric: Cognition and memory are impaired. She expresses inappropriate  judgment. She exhibits abnormal recent memory.  Pleasant today Adamant that she does not need to take some of her medications, says she only takes her bp meds regularly   Urinary incontinence due to cognitive impairment Having urge incontinence first thing in the morning.  Has 2-3 episodes and then normalizes into the day.  Takes her medicines after breakfast.  Hasn't taken any of her medicines yet today.  Will r/o UTI with UA c+s.    Unspecified hypothyroidism On synthroid.  Will f/u tsh.  Pt does not always show up for appts.  Here with a different daughter today.  Pt not adherent with medicines, but insists on taking them herself.    Vascular dementia Continues to be poorly adherent with medicines.  Is on namenda.    Senile osteoporosis Is to be taking vitamin D but they never filled the prescription.  Is on alendronate weekly and is taking it per daughter.  No recent falls.    Dyslipidemia Will f/u flp before next visit when fasting.  Cont lovastatin.

## 2012-08-06 ENCOUNTER — Other Ambulatory Visit: Payer: Self-pay | Admitting: Internal Medicine

## 2012-08-09 ENCOUNTER — Other Ambulatory Visit: Payer: Self-pay | Admitting: *Deleted

## 2012-08-09 ENCOUNTER — Ambulatory Visit (INDEPENDENT_AMBULATORY_CARE_PROVIDER_SITE_OTHER): Payer: Medicare Other | Admitting: Internal Medicine

## 2012-08-09 ENCOUNTER — Encounter: Payer: Self-pay | Admitting: Internal Medicine

## 2012-08-09 VITALS — BP 122/64 | HR 60 | Temp 94.0°F | Resp 18 | Wt 134.2 lb

## 2012-08-09 DIAGNOSIS — M1990 Unspecified osteoarthritis, unspecified site: Secondary | ICD-10-CM

## 2012-08-09 DIAGNOSIS — M25579 Pain in unspecified ankle and joints of unspecified foot: Secondary | ICD-10-CM

## 2012-08-09 DIAGNOSIS — E039 Hypothyroidism, unspecified: Secondary | ICD-10-CM

## 2012-08-09 DIAGNOSIS — Z9114 Patient's other noncompliance with medication regimen: Secondary | ICD-10-CM

## 2012-08-09 DIAGNOSIS — R609 Edema, unspecified: Secondary | ICD-10-CM

## 2012-08-09 DIAGNOSIS — H409 Unspecified glaucoma: Secondary | ICD-10-CM

## 2012-08-09 DIAGNOSIS — F0150 Vascular dementia without behavioral disturbance: Secondary | ICD-10-CM

## 2012-08-09 DIAGNOSIS — Z9119 Patient's noncompliance with other medical treatment and regimen: Secondary | ICD-10-CM

## 2012-08-09 DIAGNOSIS — I10 Essential (primary) hypertension: Secondary | ICD-10-CM

## 2012-08-09 DIAGNOSIS — R3981 Functional urinary incontinence: Secondary | ICD-10-CM

## 2012-08-09 DIAGNOSIS — M810 Age-related osteoporosis without current pathological fracture: Secondary | ICD-10-CM

## 2012-08-09 DIAGNOSIS — E785 Hyperlipidemia, unspecified: Secondary | ICD-10-CM

## 2012-08-09 DIAGNOSIS — Z91199 Patient's noncompliance with other medical treatment and regimen due to unspecified reason: Secondary | ICD-10-CM

## 2012-08-09 DIAGNOSIS — E559 Vitamin D deficiency, unspecified: Secondary | ICD-10-CM

## 2012-08-09 DIAGNOSIS — Z91148 Patient's other noncompliance with medication regimen for other reason: Secondary | ICD-10-CM

## 2012-08-09 MED ORDER — MEMANTINE HCL 10 MG PO TABS: ORAL_TABLET | ORAL | Status: DC

## 2012-08-09 MED ORDER — LOVASTATIN 20 MG PO TABS: ORAL_TABLET | ORAL | Status: DC

## 2012-08-09 MED ORDER — HYDROCHLOROTHIAZIDE 25 MG PO TABS: ORAL_TABLET | ORAL | Status: DC

## 2012-08-09 MED ORDER — VITAMIN D3 50 MCG (2000 UT) PO CAPS: 2000.0000 [IU] | ORAL_CAPSULE | Freq: Every day | ORAL | Status: DC

## 2012-08-09 MED ORDER — LEVOTHYROXINE SODIUM 100 MCG PO TABS: 100.0000 ug | ORAL_TABLET | Freq: Every day | ORAL | Status: DC

## 2012-08-09 MED ORDER — DICLOFENAC SODIUM 1 % TD GEL: TRANSDERMAL | Status: DC

## 2012-08-29 ENCOUNTER — Encounter: Payer: Self-pay | Admitting: *Deleted

## 2012-09-02 ENCOUNTER — Ambulatory Visit: Payer: Medicare Other | Admitting: Internal Medicine

## 2012-09-09 ENCOUNTER — Encounter: Payer: Self-pay | Admitting: Internal Medicine

## 2012-09-09 ENCOUNTER — Ambulatory Visit (INDEPENDENT_AMBULATORY_CARE_PROVIDER_SITE_OTHER): Payer: Medicare Other | Admitting: Internal Medicine

## 2012-09-09 ENCOUNTER — Encounter (INDEPENDENT_AMBULATORY_CARE_PROVIDER_SITE_OTHER): Payer: Medicare Other | Admitting: *Deleted

## 2012-09-09 VITALS — BP 124/68 | HR 58 | Temp 97.4°F | Resp 14 | Wt 137.0 lb

## 2012-09-09 DIAGNOSIS — I10 Essential (primary) hypertension: Secondary | ICD-10-CM

## 2012-09-09 DIAGNOSIS — F0150 Vascular dementia without behavioral disturbance: Secondary | ICD-10-CM

## 2012-09-09 DIAGNOSIS — M25579 Pain in unspecified ankle and joints of unspecified foot: Secondary | ICD-10-CM

## 2012-09-09 DIAGNOSIS — E785 Hyperlipidemia, unspecified: Secondary | ICD-10-CM

## 2012-09-09 DIAGNOSIS — R0989 Other specified symptoms and signs involving the circulatory and respiratory systems: Secondary | ICD-10-CM

## 2012-09-09 DIAGNOSIS — E039 Hypothyroidism, unspecified: Secondary | ICD-10-CM

## 2012-09-09 DIAGNOSIS — M810 Age-related osteoporosis without current pathological fracture: Secondary | ICD-10-CM

## 2012-09-09 MED ORDER — DONEPEZIL HCL 10 MG PO TABS: 10.0000 mg | ORAL_TABLET | Freq: Every day | ORAL | Status: DC

## 2012-09-09 NOTE — Patient Instructions (Signed)
Please take your medicines as ordered on this list below.  I recommend tylenol 500mg  three times daily instead of aleve or naproxen.

## 2012-09-09 NOTE — Progress Notes (Signed)
Patient ID: Jill Gutierrez, female   DOB: November 06, 1917, 79 y.o.   MRN: [id number] Location:  [facility] [place] [facility]  Code Status: DNR (paperwork previously completed with her daughter)  No Known Allergies  Chief Complaint  Patient presents with  . Follow-up    HPI: Patient is a 79 y.o. black female with vascular dementia who does not take her medications was seen in the office today for f/u of her chronic conditions.  She was to have an arterial doppler and abis last time, but the referral did not happen.  Her legs continue to swell and be discolored.  Was not taking her pain medication b/c she was not in pain, but is worried about her ankles swelling.  She complains of inability to walk and need for braces for her feet which has been the case since before I met her.  Her daughter with her today cannot provide history b/c she is not the one who takes care of the patient or gives her her medications.  Review of Systems:  Review of Systems  Constitutional: Positive for weight loss and malaise/fatigue. Negative for fever and chills.  HENT: Negative for congestion.   Eyes: Positive for blurred vision.  Respiratory: Negative for shortness of breath.   Cardiovascular: Negative for chest pain.  Gastrointestinal: Positive for heartburn and constipation.  Genitourinary: Positive for urgency. Negative for dysuria.       Urinary incontinence but pt denies  Musculoskeletal: Positive for joint pain and myalgias.  Skin: Negative for rash.  Neurological: Positive for weakness. Negative for loss of consciousness and headaches.  Endo/Heme/Allergies: Bruises/bleeds easily.  Psychiatric/Behavioral: Positive for depression and memory loss.    Past Medical History  Diagnosis Date  . Hypertension   . Thyroid disease   . Dementia   . Screening for lipoid disorders   . Alzheimer's disease   . Loss of weight   . Malignant neoplasm of thyroid gland   . Benign neoplasm of colon    . Unspecified glaucoma(365.9)   . Unspecified cataract   . Unspecified essential hypertension   . Hemorrhoids   . Reflux esophagitis   . Unspecified constipation   . Osteoporosis, unspecified   . Memory loss   . Urge incontinence   . Routine general medical examination at a health care facility   . Malignant neoplasm of thyroid gland   . Unspecified glaucoma(365.9)   . Unspecified hypothyroidism 06/03/2012    Past Surgical History  Procedure Laterality Date  . Thyroidectomy    . No past surgeries    . Hemorrhoidectomy    . Eye surgery      Social History:   reports that she has never smoked. She does not have any smokeless tobacco history on file. She reports that she does not drink alcohol or use illicit drugs.  Family History  Problem Relation Age of Onset  . Diabetes Father     Medications: Patient's Medications  New Prescriptions   No medications on file  Previous Medications   ALENDRONATE (FOSAMAX) 70 MG TABLET    Take 70 mg by mouth every 7 (seven) days. Take 1 tablet once weekly in the am, stay upright for 30 minutes afterwards.   BACITRACIN-POLYMYXIN B (POLYSPORIN) OINTMENT    Apply topically 2 (two) times daily. Apply on eyelids at bedtime.   CHOLECALCIFEROL (VITAMIN D3) 2000 UNITS CAPSULE    Take 1 capsule (2,000 Units total) by mouth daily.   DICLOFENAC SODIUM (VOLTAREN) 1 %  GEL    Apply to hands as needed.   DONEPEZIL (ARICEPT) 10 MG TABLET    Take 10 mg by mouth at bedtime as needed. Take 1 tablet daily to help preserve memory.   HYDROCHLOROTHIAZIDE (HYDRODIURIL) 25 MG TABLET    Take one tablet once a day for blood pressure   LATANOPROST (XALATAN) 0.005 % OPHTHALMIC SOLUTION    Place 1 drop into both eyes daily. Place 1 drop into both eyes daily for glaucoma.   LEVOTHYROXINE (SYNTHROID, LEVOTHROID) 100 MCG TABLET    Take 1 tablet (100 mcg total) by mouth daily.   LOVASTATIN (MEVACOR) 20 MG TABLET    Take one tablet at bedtime for cholesterol   MEMANTINE  (NAMENDA) 10 MG TABLET    Take one tablet twice a day for memory   NAPROXEN SODIUM (ANAPROX) 550 MG TABLET    Take 550 mg by mouth 2 (two) times daily with a meal.   POLYETHYL GLYCOL-PROPYL GLYCOL (SYSTANE) 0.4-0.3 % SOLN    Apply to eye at bedtime. Instill 1 drop into both eyes at bedtime.   POLYETHYLENE GLYCOL (MIRALAX / GLYCOLAX) PACKET    Take 17 g by mouth daily. Take 1 tablespoonful in 8 oz of water once daily for constipation.  Modified Medications   No medications on file  Discontinued Medications   No medications on file   Physical Exam: Filed Vitals:   09/09/12 1356  BP: 124/68  Pulse: 58  Temp: 97.4 F (36.3 C)  TempSrc: Oral  Resp: 14  Weight: 137 lb (62.143 kg)  SpO2: 96%  Physical Exam  Constitutional:  Frail black female seated in wheelchair, wearing braces on bilateral feet for foot drop  Cardiovascular: Normal rate, regular rhythm, normal heart sounds and intact distal pulses.   Pulmonary/Chest: Effort normal and breath sounds normal. No respiratory distress.  Abdominal: Soft. Bowel sounds are normal. She exhibits no distension. There is no tenderness.  Musculoskeletal: Normal range of motion.  Bilateral foot drop  Neurological: She is alert.  Very poor short term memory  Skin: Skin is warm and dry.  Psychiatric:  Irritable, angry that her feet cannot be fixed, but has not gotten test I ordered even though family was aware of it    Labs reviewed: Basic Metabolic Panel:  Recent Labs  09/25/11 0514 09/26/11 0335 06/03/12 1621  NA 142 144 144  K 3.5 3.5 4.0  CL 104 108 102  CO2 27 27 29*  GLUCOSE 66* 99 81  BUN 20 16 15   CREATININE 0.97 0.99 0.93  CALCIUM 9.1 8.5 8.8  MG 1.9  --   --   PHOS 3.1  --   --   TSH 5.897*  --  0.345*   Liver Function Tests:  Recent Labs  09/25/11 0514  AST 17  ALT 6  ALKPHOS 66  BILITOT 0.6  PROT 6.3  ALBUMIN 3.0*  CBC:  Recent Labs  09/24/11 2028 09/25/11 0514 09/25/11 1530 09/26/11 0335  WBC 5.5 5.2   --   --   NEUTROABS 3.5 3.5  --   --   HGB 11.0* 11.3* 11.7* 10.3*  HCT 33.7* 35.0* 35.9* 32.3*  MCV 91.6 91.1  --   --   PLT 165 129*  --   --    Lab Results  Component Value Date   TSH 0.341* 09/09/2012    Assessment/Plan 1. Vascular dementia, with behavioral disturbance - renewed aricept - donepezil (ARICEPT) 10 MG tablet; Take 1 tablet (10 mg total)  by mouth at bedtime. Take 1 tablet daily to help preserve memory.  Dispense: 30 tablet; Refill: 3 - f/u labs due to decline: -TSH - CBC with Differential - Basic metabolic panel  2. Senile osteoporosis - cont ca and vit d supplementation though it is unclear if she actually takes what is ordered and her daughters cannot give me a reliable history either and don't seem to recall our discussions between visits  3. Pain in joint, ankle and foot, unspecified laterality -has bilateral foot drop and I never got records that explained why -she also has pain and discoloration of the feet and I ordered arterial dopplers with abis but pt's family did not take her for test--explained again today   4. Unspecified hypothyroidism -f/u labs - TSH  5. Essential hypertension, benign - bp at goal, says she does take her bp meds - CBC with Differential - Basic metabolic panel  6. Hyperlipidemia LDL goal < 100 -last lipids in misys which I can no longer access so unknown -would like to check at next visit--advised to make appt in the morning so pt can fast for bloodwork   Labs/tests ordered: Orders Placed This Encounter  Procedures  . TSH  . CBC with Differential  . Basic metabolic panel   Next appt: 4 mos

## 2012-09-10 LAB — BASIC METABOLIC PANEL
BUN/Creatinine Ratio: 15 (ref 11–26)
BUN: 16 mg/dL (ref 10–36)
CO2: 31 mmol/L — ABNORMAL HIGH (ref 18–29)
Calcium: 9.3 mg/dL (ref 8.6–10.2)
Chloride: 103 mmol/L (ref 97–108)
Creatinine, Ser: 1.06 mg/dL — ABNORMAL HIGH (ref 0.57–1.00)
GFR calc Af Amer: 53 mL/min/{1.73_m2} — ABNORMAL LOW (ref >59)
GFR calc non Af Amer: 46 mL/min/{1.73_m2} — ABNORMAL LOW (ref >59)
Glucose: 86 mg/dL (ref 65–99)
Potassium: 3.8 mmol/L (ref 3.5–5.2)
Sodium: 145 mmol/L — ABNORMAL HIGH (ref 134–144)

## 2012-09-10 LAB — CBC WITH DIFFERENTIAL/PLATELET
Basophils Absolute: 0 10*3/uL (ref 0.0–0.2)
Basos: 0 % (ref 0–3)
Eos: 2 % (ref 0–5)
Eosinophils Absolute: 0.1 10*3/uL (ref 0.0–0.4)
HCT: 38.8 % (ref 34.0–46.6)
Hemoglobin: 12.3 g/dL (ref 11.1–15.9)
Immature Grans (Abs): 0 10*3/uL (ref 0.0–0.1)
Immature Granulocytes: 0 % (ref 0–2)
Lymphocytes Absolute: 1 10*3/uL (ref 0.7–3.1)
Lymphs: 31 % (ref 14–46)
MCH: 27.6 pg (ref 26.6–33.0)
MCHC: 31.7 g/dL (ref 31.5–35.7)
MCV: 87 fL (ref 79–97)
Monocytes Absolute: 0.3 10*3/uL (ref 0.1–0.9)
Monocytes: 9 % (ref 4–12)
Neutrophils Absolute: 1.8 10*3/uL (ref 1.4–7.0)
Neutrophils Relative %: 58 % (ref 40–74)
RBC: 4.46 x10E6/uL (ref 3.77–5.28)
RDW: 14.4 % (ref 12.3–15.4)
WBC: 3.1 10*3/uL — ABNORMAL LOW (ref 3.4–10.8)

## 2012-09-10 LAB — TSH: TSH: 0.341 u[IU]/mL — ABNORMAL LOW (ref 0.450–4.500)

## 2012-10-10 ENCOUNTER — Encounter: Payer: Self-pay | Admitting: Geriatric Medicine

## 2012-10-25 ENCOUNTER — Encounter: Payer: Self-pay | Admitting: Geriatric Medicine

## 2012-10-28 ENCOUNTER — Telehealth: Payer: Self-pay | Admitting: *Deleted

## 2012-10-28 NOTE — Telephone Encounter (Signed)
Received test results from [facility]: The right ankle brachial index is 1.15, which is within normal limits at rest. The resting left ankle brachial index could not be calculated due to non compressible vessels indicating the presence of medial calcification. Waveform analysis suggests moderate arterial insufficiency.  Per Dr. Allison Quarry notify daughter: Moderate arterial calcification--this contributes to her cold feet and pain.  Patient and daughter Notified.

## 2013-02-02 NOTE — Progress Notes (Deleted)
Subjective:     Patient ID: Jill Gutierrez, female   DOB: 14-Oct-1917, 79 y.o.   MRN: [id number]  HPI   Review of Systems     Objective:   Physical Exam     Assessment:     ***    Plan:     ***

## 2013-02-02 NOTE — Progress Notes (Signed)
Patient ID: Jill Gutierrez, female   DOB: 04-Oct-1917, 79 y.o.   MRN: [id number]   Location:  [facility] [place] [facility]  Code Status: DNR   No Known Allergies  Chief Complaint  Patient presents with  . bilateral swelling in feet    painful    HPI: Patient is a 79 y.o. black female with h/o vascular dementia, medication nonadherence, hypothyroidism, osteoporosis, and drop foot bilaterally seen in the office today for medical mgt of chronic diseases.  I have never received her records from her prior physicians.  Each visit she c/o the same concerns and does not take her medications and her family has not been able to get her to take them.  Today, she c/o leg swelling, but will not wear compression hose.    Review of Systems:  Review of Systems  Constitutional: Positive for weight loss and malaise/fatigue. Negative for fever and chills.  HENT: Negative for hearing loss.   Eyes: Positive for blurred vision.  Respiratory: Negative for cough and shortness of breath.   Cardiovascular: Positive for leg swelling. Negative for chest pain, palpitations, orthopnea and PND.  Gastrointestinal: Negative for abdominal pain, blood in stool and melena.  Genitourinary: Negative for dysuria.  Musculoskeletal: Positive for joint pain. Negative for falls.  Skin: Negative for rash.  Neurological: Positive for weakness. Negative for loss of consciousness.  Psychiatric/Behavioral: Positive for depression and memory loss.     Past Medical History  Diagnosis Date  . Hypertension   . Thyroid disease   . Dementia   . Screening for lipoid disorders   . Alzheimer's disease   . Loss of weight   . Malignant neoplasm of thyroid gland   . Benign neoplasm of colon   . Unspecified glaucoma(365.9)   . Unspecified cataract   . Unspecified essential hypertension   . Hemorrhoids   . Reflux esophagitis   . Unspecified constipation   . Osteoporosis, unspecified   . Memory loss   . Urge  incontinence   . Routine general medical examination at a health care facility   . Malignant neoplasm of thyroid gland   . Unspecified glaucoma(365.9)   . Unspecified hypothyroidism 06/03/2012    Past Surgical History  Procedure Laterality Date  . Thyroidectomy    . No past surgeries    . Hemorrhoidectomy    . Eye surgery      Social History:   reports that she has never smoked. She does not have any smokeless tobacco history on file. She reports that she does not drink alcohol or use illicit drugs.  Family History  Problem Relation Age of Onset  . Diabetes Father     Medications: Patient's Medications  New Prescriptions   No medications on file  Previous Medications   ALENDRONATE (FOSAMAX) 70 MG TABLET    Take 70 mg by mouth every 7 (seven) days. Take 1 tablet once weekly in the am, stay upright for 30 minutes afterwards.   BACITRACIN-POLYMYXIN B (POLYSPORIN) OINTMENT    Apply topically 2 (two) times daily. Apply on eyelids at bedtime.   LATANOPROST (XALATAN) 0.005 % OPHTHALMIC SOLUTION    Place 1 drop into both eyes daily. Place 1 drop into both eyes daily for glaucoma.   NAPROXEN SODIUM (ANAPROX) 550 MG TABLET    Take 550 mg by mouth 2 (two) times daily with a meal.   POLYETHYL GLYCOL-PROPYL GLYCOL (SYSTANE) 0.4-0.3 % SOLN    Apply to eye at bedtime. Instill 1  drop into both eyes at bedtime.   POLYETHYLENE GLYCOL (MIRALAX / GLYCOLAX) PACKET    Take 17 g by mouth daily. Take 1 tablespoonful in 8 oz of water once daily for constipation.  Modified Medications   Modified Medication Previous Medication   CHOLECALCIFEROL (VITAMIN D3) 2000 UNITS CAPSULE Cholecalciferol (VITAMIN D3) 2000 UNITS capsule      Take 1 capsule (2,000 Units total) by mouth daily.    Take 1 capsule (2,000 Units total) by mouth daily.   DICLOFENAC SODIUM (VOLTAREN) 1 % GEL diclofenac sodium (VOLTAREN) 1 % GEL      Apply to hands as needed.    Apply to hands as needed.   DONEPEZIL (ARICEPT) 10 MG TABLET  donepezil (ARICEPT) 10 MG tablet      Take 1 tablet (10 mg total) by mouth at bedtime. Take 1 tablet daily to help preserve memory.    Take 10 mg by mouth at bedtime as needed. Take 1 tablet daily to help preserve memory.   HYDROCHLOROTHIAZIDE (HYDRODIURIL) 25 MG TABLET hydrochlorothiazide (HYDRODIURIL) 25 MG tablet      Take one tablet once a day for blood pressure    TAKE ONE TABLET BY MOUTH EVERY DAY FOR BLOOD PRESSURE   LEVOTHYROXINE (SYNTHROID, LEVOTHROID) 100 MCG TABLET levothyroxine (SYNTHROID, LEVOTHROID) 100 MCG tablet      Take 1 tablet (100 mcg total) by mouth daily.    Take 100 mcg by mouth daily.   LOVASTATIN (MEVACOR) 20 MG TABLET lovastatin (MEVACOR) 20 MG tablet      Take one tablet at bedtime for cholesterol    Take 20 mg by mouth at bedtime.   MEMANTINE (NAMENDA) 10 MG TABLET memantine (NAMENDA) 10 MG tablet      Take one tablet twice a day for memory    Take 10 mg by mouth 2 (two) times daily. Take one tablet twice a day for memory  Discontinued Medications   CIPROFLOXACIN (CIPRO) 500 MG TABLET    Take one tablet by mouth twice daily for 7 days.   DICLOFENAC SODIUM (VOLTAREN) 1 % GEL    Apply four grams topically to affected area every 6 hours as needed for pain.     Physical Exam: Filed Vitals:   08/09/12 1112  BP: 122/64  Pulse: 60  Temp: 94 F (34.4 C)  Resp: 18  Weight: 134 lb 3.2 oz (60.873 kg)  Physical Exam  Constitutional: No distress.  HENT:  Head: Normocephalic and atraumatic.  Neck: No JVD present.  Cardiovascular:  irreg irreg  Pulmonary/Chest: Effort normal and breath sounds normal. No respiratory distress. She has no rales.  Abdominal: Soft. Bowel sounds are normal. She exhibits no distension. There is no tenderness.  Musculoskeletal:  Bilateral foot drop, wears braces  Neurological: She is alert.  Oriented to person and place, not time  Skin: Skin is warm and dry.  Psychiatric:  Irritable, very poor short term memory    Labs  reviewed: Basic Metabolic Panel:  Recent Labs  06/03/12 1621 09/09/12 1449  NA 144 145*  K 4.0 3.8  CL 102 103  CO2 29* 31*  GLUCOSE 81 86  BUN 15 16  CREATININE 0.93 1.06*  CALCIUM 8.8 9.3  TSH 0.345* 0.341*  CBC:  Recent Labs  09/09/12 1449  WBC 3.1*  NEUTROABS 1.8  HGB 12.3  HCT 38.8  MCV 87   Assessment/Plan 1. Pain in joint, ankle and foot, unspecified laterality - Lower Extremity Arterial Doppler Bilateral; Future -has swelling bilaterally with  increased pain and said to have had discoloration of feet which is no longer present -need to rule out any arterial occlusive disease with arterial dopplers with ABIs 2. Edema -has had some mild edema chronically -doubt she'll wear compression hose after arterial disease is ruled out--previously prescribed by another physician and she was not adherent with them 3. Vascular dementia -moderate, cont medications for her memory though she takes her meds only when she wants to  4. Noncompliance with medication treatment due to underuse of medication -due to her dementia at least in part -also seems very stubborn about prior thoughts about her medical condition that are no longer true -daughters have been unable to get her to take meds and she frequently runs out and they are not refilled on time  Labs/tests ordered:  Arterial doppler bilateral LEs Next appt:  3 mos unless abnormal

## 2013-04-14 ENCOUNTER — Telehealth: Payer: Self-pay | Admitting: *Deleted

## 2013-04-14 NOTE — Telephone Encounter (Signed)
Spoke to pt's daughter, (angela), regarding a form received from [employer] for a knee & ankle brace.   She will have her sister call and schedule an appointment to f/u cause was due in November 2014.

## 2013-08-01 ENCOUNTER — Telehealth: Payer: Self-pay | Admitting: Internal Medicine

## 2013-08-01 NOTE — Telephone Encounter (Signed)
Called pt to schedule appt- left message.  Last appt 07-14..Carolin Coy

## 2013-09-07 ENCOUNTER — Other Ambulatory Visit: Payer: Self-pay | Admitting: Internal Medicine

## 2013-09-09 ENCOUNTER — Other Ambulatory Visit: Payer: Self-pay | Admitting: Internal Medicine

## 2013-09-29 ENCOUNTER — Other Ambulatory Visit: Payer: Self-pay | Admitting: Internal Medicine

## 2013-10-20 ENCOUNTER — Ambulatory Visit (INDEPENDENT_AMBULATORY_CARE_PROVIDER_SITE_OTHER): Payer: Medicare Other | Admitting: Internal Medicine

## 2013-10-20 ENCOUNTER — Encounter: Payer: Self-pay | Admitting: Internal Medicine

## 2013-10-20 VITALS — BP 132/68 | HR 63 | Temp 97.9°F | Wt 150.8 lb

## 2013-10-20 DIAGNOSIS — F0150 Vascular dementia without behavioral disturbance: Secondary | ICD-10-CM

## 2013-10-20 DIAGNOSIS — H409 Unspecified glaucoma: Secondary | ICD-10-CM

## 2013-10-20 DIAGNOSIS — E039 Hypothyroidism, unspecified: Secondary | ICD-10-CM

## 2013-10-20 DIAGNOSIS — I10 Essential (primary) hypertension: Secondary | ICD-10-CM

## 2013-10-20 DIAGNOSIS — E785 Hyperlipidemia, unspecified: Secondary | ICD-10-CM

## 2013-10-20 DIAGNOSIS — E559 Vitamin D deficiency, unspecified: Secondary | ICD-10-CM

## 2013-10-20 DIAGNOSIS — R3981 Functional urinary incontinence: Secondary | ICD-10-CM

## 2013-10-20 DIAGNOSIS — M810 Age-related osteoporosis without current pathological fracture: Secondary | ICD-10-CM

## 2013-10-20 MED ORDER — DICLOFENAC SODIUM 1 % TD GEL: TRANSDERMAL | Status: DC

## 2013-10-20 MED ORDER — MEMANTINE HCL-DONEPEZIL HCL ER 28-10 MG PO CP24: 1.0000 | ORAL_CAPSULE | Freq: Every day | ORAL | Status: DC

## 2013-10-20 MED ORDER — LEVOTHYROXINE SODIUM 100 MCG PO TABS: ORAL_TABLET | ORAL | Status: DC

## 2013-10-20 MED ORDER — VITAMIN D3 50 MCG (2000 UT) PO CAPS: 2000.0000 [IU] | ORAL_CAPSULE | Freq: Every day | ORAL | Status: DC

## 2013-10-20 MED ORDER — HYDROCHLOROTHIAZIDE 25 MG PO TABS: ORAL_TABLET | ORAL | Status: DC

## 2013-10-20 NOTE — Progress Notes (Signed)
Patient ID: Jill Gutierrez, female   DOB: 1917/09/01, 80 y.o.   MRN: [id number]   Location:  [facility] [place] [facility]  Code Status: family has never brought in hcpoa paperwork though it has been requested several times  No Known Allergies  Chief Complaint  Patient presents with  . Follow-up    f/u medications & refills(per [name], daughter)  . other    neg for fall screening    HPI: Patient is a 80 y.o. black female seen in the office today for medical mgt of chronic diseases.  As usual, her daughter who is with her does not know her medications and [name] had to be called.  Meds were reviewed with her.    Has dry scales up her right vs. left nostril.  Not painful.    Needs her meds refilled to [facility].    Feet still numb and tingling.  Not wearing her braces.  Hands and fingers also feel that way.    Eating well she says.  Daughter brings a lot of food.  Sleeping well at night, and has trouble getting up in the morning.  Does not feel tired during the day though.  Acid reflux still bothers her on left more than left side.    BP good today.    Has gained weight suggesting that she is being better taken care of now.    Review of Systems:  Review of Systems  Constitutional: Negative for fever, chills and malaise/fatigue.  HENT: Positive for hearing loss. Negative for congestion.   Eyes: Positive for blurred vision.       Glaucoma  Respiratory: Negative for cough and shortness of breath.   Cardiovascular: Negative for chest pain, palpitations and leg swelling.  Gastrointestinal: Positive for heartburn. Negative for abdominal pain, constipation, blood in stool and melena.  Genitourinary: Negative for dysuria.  Musculoskeletal: Negative for falls and myalgias.  Skin: Negative for rash.  Neurological: Positive for sensory change and weakness. Negative for dizziness and loss of consciousness.  Psychiatric/Behavioral: Positive for memory loss. Negative  for depression. The patient does not have insomnia.     Past Medical History  Diagnosis Date  . Hypertension   . Thyroid disease   . Dementia   . Screening for lipoid disorders   . Alzheimer's disease   . Loss of weight   . Malignant neoplasm of thyroid gland   . Benign neoplasm of colon   . Unspecified glaucoma   . Unspecified cataract   . Unspecified essential hypertension   . Hemorrhoids   . Reflux esophagitis   . Unspecified constipation   . Osteoporosis, unspecified   . Memory loss   . Urge incontinence   . Routine general medical examination at a health care facility   . Malignant neoplasm of thyroid gland   . Unspecified glaucoma   . Unspecified hypothyroidism 06/03/2012    Past Surgical History  Procedure Laterality Date  . Thyroidectomy    . No past surgeries    . Hemorrhoidectomy    . Eye surgery      Social History:   reports that she has never smoked. She does not have any smokeless tobacco history on file. She reports that she does not drink alcohol or use illicit drugs.  Family History  Problem Relation Age of Onset  . Diabetes Father     Medications: Patient's Medications  New Prescriptions   No medications on file  Previous Medications   ALENDRONATE (FOSAMAX)  70 MG TABLET    TAKE ONE TABLET ONCE A WEEK   BISACODYL (DULCOLAX) 5 MG EC TABLET    Take 5 mg by mouth daily as needed for moderate constipation.   CHOLECALCIFEROL (VITAMIN D3) 2000 UNITS CAPSULE    Take 1 capsule (2,000 Units total) by mouth daily.   DICLOFENAC SODIUM (VOLTAREN) 1 % GEL    Apply to hands as needed.   DONEPEZIL (ARICEPT) 10 MG TABLET    Take 1 tablet (10 mg total) by mouth at bedtime. Take 1 tablet daily to help preserve memory.   HYDROCHLOROTHIAZIDE (HYDRODIURIL) 25 MG TABLET    TAKE ONE TABLET BY MOUTH ONCE DAILY   LATANOPROST (XALATAN) 0.005 % OPHTHALMIC SOLUTION    Place 1 drop into both eyes daily. Place 1 drop into both eyes daily for glaucoma.   LEVOTHYROXINE  (SYNTHROID, LEVOTHROID) 100 MCG TABLET    TAKE ONE TABLET BY MOUTH ONCE DAILY   MEMANTINE (NAMENDA) 10 MG TABLET    Take one tablet twice a day for memory  Modified Medications   No medications on file  Discontinued Medications   BACITRACIN-POLYMYXIN B (POLYSPORIN) OINTMENT    Apply topically 2 (two) times daily. Apply on eyelids at bedtime.   LOVASTATIN (MEVACOR) 20 MG TABLET    Take one tablet at bedtime for cholesterol   NAPROXEN SODIUM (ANAPROX) 550 MG TABLET    Take 550 mg by mouth 2 (two) times daily with a meal.   POLYETHYL GLYCOL-PROPYL GLYCOL (SYSTANE) 0.4-0.3 % SOLN    Apply to eye at bedtime. Instill 1 drop into both eyes at bedtime.   POLYETHYLENE GLYCOL (MIRALAX / GLYCOLAX) PACKET    Take 17 g by mouth daily. Take 1 tablespoonful in 8 oz of water once daily for constipation.     Physical Exam: Filed Vitals:   10/20/13 1419  BP: 132/68  Pulse: 63  Temp: 97.9 F (36.6 C)  TempSrc: Oral  Weight: 150 lb 12.8 oz (68.402 kg)  SpO2: 98%  Physical Exam  Labs reviewed: Has missed several appts and has not come for labs either  Past Procedures: Does not follow through with things that are ordered  Assessment/Plan 1. Unspecified vitamin D deficiency - cont supplement - Cholecalciferol (VITAMIN D3) 2000 UNITS capsule; Take 1 capsule (2,000 Units total) by mouth daily.  Dispense: 30 capsule; Refill: 3  2. Urinary incontinence due to cognitive impairment - longstanding -unchanged -due to cognition, cannot tolerate anticholinergics and they counteract her aricept  3. Senile osteoporosis - cannot tolerate oral bisphosphonates and her family has not been involved enough to arrange alternatives like prolia for her--pt sometimes is resistant to coming to visits and taking meds also - Cholecalciferol (VITAMIN D3) 2000 UNITS capsule; Take 1 capsule (2,000 Units total) by mouth daily.  Dispense: 30 capsule; Refill: 3  4. Unspecified hypothyroidism - has been very difficult to  control with medication nonadherence and family not refilling meds when she runs out just waiting for weeks to mos until visits as well as missing visits - levothyroxine (SYNTHROID, LEVOTHROID) 100 MCG tablet; TAKE ONE TABLET BY MOUTH ONCE DAILY  Dispense: 30 tablet; Refill: 3 - TSH  5. Dyslipidemia -never comes fasting to follow up on this -pt has refused medications for this and does not take others that are essential--given she's 89 with dementia, I am not pushing this one  6. Essential hypertension, benign - f/u cmp and refill hctz -has been out of meds again - hydrochlorothiazide (HYDRODIURIL) 25 MG tablet;  TAKE ONE TABLET BY MOUTH ONCE DAILY  Dispense: 30 tablet; Refill: 3  7. Glaucoma -cont eye drops as per her ophthalmologist who should be the one to fill and change these not [place]  8. Vascular dementia, without behavioral disturbance - will try to get her on namzaric regularly sprinkled in food if necessary -family does not seem to understand her level of dementia despite numerous counseling sessions demanding they provide her with more assistance with her meds--her daughter who cannot come to appts fills her pillbox, but pt is still expected to then take the meds which she cannot remember, and they do not refill them in time - Memantine HCl-Donepezil HCl (NAMZARIC) 28-10 MG CP24; Take 1 capsule by mouth daily. Opened and sprinkled in food  Dispense: 30 capsule; Refill: 3 - CBC With differential/Platelet  Labs/tests ordered:   Orders Placed This Encounter  Procedures  . TSH  . Comprehensive metabolic panel    Order Specific Question:  Has the patient fasted?    Answer:  Yes  . CBC With differential/Platelet    Next appt:  3 mos for dementia

## 2013-10-20 NOTE — Patient Instructions (Addendum)
Ms. Folk is no longer able to manage her own medications.  We need you to give them to her.  I started namzaric which is a combination of her aricept and namenda in one capsule that you can put on her food.  She won't have to know you are doing this and will not have to give consent to take her medication properly.  You may use an over the counter nasal saline spray if she complains of her nose being dry.  Do not use this for more than a week at a time because it can cause irritation and bleeding.  Use an over the counter muscle rub on her right shoulder area that is painful.

## 2013-10-21 LAB — CBC WITH DIFFERENTIAL
Basophils Absolute: 0 10*3/uL (ref 0.0–0.2)
Basos: 0 %
Eos: 2 %
Eosinophils Absolute: 0.1 10*3/uL (ref 0.0–0.4)
HCT: 39.5 % (ref 34.0–46.6)
Hemoglobin: 12.6 g/dL (ref 11.1–15.9)
Immature Grans (Abs): 0 10*3/uL (ref 0.0–0.1)
Immature Granulocytes: 0 %
Lymphocytes Absolute: 1 10*3/uL (ref 0.7–3.1)
Lymphs: 27 %
MCH: 28.7 pg (ref 26.6–33.0)
MCHC: 31.9 g/dL (ref 31.5–35.7)
MCV: 90 fL (ref 79–97)
Monocytes Absolute: 0.3 10*3/uL (ref 0.1–0.9)
Monocytes: 7 %
Neutrophils Absolute: 2.4 10*3/uL (ref 1.4–7.0)
Neutrophils Relative %: 64 %
Platelets: 156 10*3/uL (ref 150–379)
RBC: 4.39 x10E6/uL (ref 3.77–5.28)
RDW: 14.1 % (ref 12.3–15.4)
WBC: 3.8 10*3/uL (ref 3.4–10.8)

## 2013-10-21 LAB — COMPREHENSIVE METABOLIC PANEL
ALT: 7 IU/L (ref 0–32)
AST: 18 IU/L (ref 0–40)
Albumin/Globulin Ratio: 1.5 (ref 1.1–2.5)
Albumin: 4 g/dL (ref 3.2–4.6)
Alkaline Phosphatase: 73 IU/L (ref 39–117)
BUN/Creatinine Ratio: 13 (ref 11–26)
BUN: 15 mg/dL (ref 10–36)
CO2: 25 mmol/L (ref 18–29)
Calcium: 9.3 mg/dL (ref 8.7–10.3)
Chloride: 102 mmol/L (ref 97–108)
Creatinine, Ser: 1.15 mg/dL — ABNORMAL HIGH (ref 0.57–1.00)
GFR calc Af Amer: 47 mL/min/{1.73_m2} — ABNORMAL LOW (ref >59)
GFR calc non Af Amer: 41 mL/min/{1.73_m2} — ABNORMAL LOW (ref >59)
Globulin, Total: 2.6 g/dL (ref 1.5–4.5)
Glucose: 83 mg/dL (ref 65–99)
Potassium: 3.7 mmol/L (ref 3.5–5.2)
Sodium: 145 mmol/L — ABNORMAL HIGH (ref 134–144)
Total Bilirubin: 0.4 mg/dL (ref 0.0–1.2)
Total Protein: 6.6 g/dL (ref 6.0–8.5)

## 2013-10-21 LAB — TSH: TSH: 0.274 u[IU]/mL — ABNORMAL LOW (ref 0.450–4.500)

## 2013-10-22 ENCOUNTER — Other Ambulatory Visit: Payer: Self-pay | Admitting: *Deleted

## 2013-10-22 MED ORDER — LEVOTHYROXINE SODIUM 75 MCG PO TABS: 75.0000 ug | ORAL_TABLET | Freq: Every day | ORAL | Status: DC

## 2013-10-22 NOTE — Telephone Encounter (Signed)
Angela notified VIA phone and new RX sent to the pharmacy. Was advised to make sure she starts keeping up with her mother's medications.

## 2013-10-23 ENCOUNTER — Telehealth: Payer: Self-pay | Admitting: *Deleted

## 2013-10-23 NOTE — Telephone Encounter (Signed)
Filled out Prior Authorization Request Form for Namzaric to [facility]. Faxed back # [phone]

## 2013-10-24 NOTE — Telephone Encounter (Signed)
Namzaric Approved through 03/05/14

## 2013-11-20 ENCOUNTER — Ambulatory Visit (INDEPENDENT_AMBULATORY_CARE_PROVIDER_SITE_OTHER): Payer: Medicare Other | Admitting: Internal Medicine

## 2013-11-20 ENCOUNTER — Encounter: Payer: Self-pay | Admitting: Internal Medicine

## 2013-11-20 VITALS — BP 112/60 | HR 40 | Temp 97.7°F | Resp 18 | Ht 60.0 in | Wt 149.6 lb

## 2013-11-20 DIAGNOSIS — F0150 Vascular dementia without behavioral disturbance: Secondary | ICD-10-CM

## 2013-11-20 DIAGNOSIS — R1310 Dysphagia, unspecified: Secondary | ICD-10-CM | POA: Insufficient documentation

## 2013-11-20 DIAGNOSIS — K649 Unspecified hemorrhoids: Secondary | ICD-10-CM | POA: Insufficient documentation

## 2013-11-20 NOTE — Progress Notes (Signed)
Patient ID: Jill Gutierrez, female   DOB: 14-Sep-1917, 80 y.o.   MRN: [id number]   Location:  [facility] / [place]   No Known Allergies  Chief Complaint  Patient presents with  . Acute Visit    having trouble swallowing x 5mos ( hard food)    HPI: Patient is a 80 y.o.  seen in the office today for acute visit due to increased difficulty swallowing solid foods.  She has poor dentition.  No difficulty with liquids or soft foods.  Dentures do not fit right.  Swallowing has gotten worse in past 2 mos.    Had blood in stool in past.   A few days ago she had a little bit when she wiped her bottom (hemorrhoids). Happened 2-3 days in a row.  No pain.    Says she still didn't get the voltaren gel b/c directions had to be clarified and prior auth was done.  Should be there now.    Pt says she has not been taking the memory pills, but will check other daughter.  Review of Systems:  Review of Systems  Constitutional: Negative for fever.  HENT:       Difficulty swallowing, dentures also poorly fitting  Eyes: Negative for blurred vision.  Respiratory: Negative for shortness of breath.   Cardiovascular: Negative for chest pain, palpitations and leg swelling.  Gastrointestinal: Positive for blood in stool. Negative for constipation and melena.       On toilet paper only  Genitourinary: Positive for urgency. Negative for dysuria and frequency.  Musculoskeletal: Negative for falls and myalgias.  Neurological: Negative for dizziness and headaches.  Psychiatric/Behavioral: Positive for memory loss.    Past Medical History  Diagnosis Date  . Hypertension   . Thyroid disease   . Dementia   . Screening for lipoid disorders   . Alzheimer's disease   . Loss of weight   . Malignant neoplasm of thyroid gland   . Benign neoplasm of colon   . Unspecified glaucoma   . Unspecified cataract   . Unspecified essential hypertension   . Hemorrhoids   . Reflux esophagitis     . Unspecified constipation   . Osteoporosis, unspecified   . Memory loss   . Urge incontinence   . Routine general medical examination at a health care facility   . Malignant neoplasm of thyroid gland   . Unspecified glaucoma   . Unspecified hypothyroidism 06/03/2012    Past Surgical History  Procedure Laterality Date  . Thyroidectomy    . No past surgeries    . Hemorrhoidectomy    . Eye surgery      Social History:   reports that she has never smoked. She does not have any smokeless tobacco history on file. She reports that she does not drink alcohol or use illicit drugs.  Family History  Problem Relation Age of Onset  . Diabetes Father     Medications: Patient's Medications  New Prescriptions   No medications on file  Previous Medications   ALENDRONATE (FOSAMAX) 70 MG TABLET    TAKE ONE TABLET ONCE A WEEK   DICLOFENAC SODIUM (VOLTAREN) 1 % GEL    Apply to hands as needed.   HYDROCHLOROTHIAZIDE (HYDRODIURIL) 25 MG TABLET    TAKE ONE TABLET BY MOUTH ONCE DAILY   LATANOPROST (XALATAN) 0.005 % OPHTHALMIC SOLUTION    Place 1 drop into both eyes daily. Place 1 drop into both eyes daily for glaucoma.  LEVOTHYROXINE (SYNTHROID, LEVOTHROID) 75 MCG TABLET    Take 1 tablet (75 mcg total) by mouth daily before breakfast.   MEMANTINE HCL-DONEPEZIL HCL (NAMZARIC) 28-10 MG CP24    Take 1 capsule by mouth daily. Opened and sprinkled in food  Modified Medications   No medications on file  Discontinued Medications   BISACODYL (DULCOLAX) 5 MG EC TABLET    Take 5 mg by mouth daily as needed for moderate constipation.   CHOLECALCIFEROL (VITAMIN D3) 2000 UNITS CAPSULE    Take 1 capsule (2,000 Units total) by mouth daily.     Physical Exam: Filed Vitals:   11/20/13 1508  BP: 112/60  Pulse: 40  Temp: 97.7 F (36.5 C)  TempSrc: Oral  Resp: 18  Height: 5' (1.524 m)  Weight: 149 lb 9.6 oz (67.858 kg)  SpO2: 99%  Physical Exam  Constitutional: No distress.  Cardiovascular: Normal  rate, regular rhythm, normal heart sounds and intact distal pulses.   Pulmonary/Chest: Effort normal and breath sounds normal. No respiratory distress.  Abdominal: Soft. Bowel sounds are normal. She exhibits no distension and no mass. There is no tenderness.  DRE neg for blood, has hemorrhoids    Labs reviewed: Basic Metabolic Panel:  Recent Labs  10/20/13 1542  NA 145*  K 3.7  CL 102  CO2 25  GLUCOSE 83  BUN 15  CREATININE 1.15*  CALCIUM 9.3  TSH 0.274*   Liver Function Tests:  Recent Labs  10/20/13 1542  AST 18  ALT 7  ALKPHOS 73  BILITOT 0.4  PROT 6.6  CBC:  Recent Labs  10/20/13 1542  WBC 3.8  NEUTROABS 2.4  HGB 12.6  HCT 39.5  MCV 90  PLT 156    Assessment/Plan 1. Dysphagia -cont soft foods until we have results of swallowing evaluation -suspect a lot of this is due to her poorly fitting dentures--recommended they f/u with dentist about this - SLP modified barium swallow; Future  2. Hemorrhoids, unspecified hemorrhoid type - has h/o these and story suggests this, no active bleeding - CBC With differential/Platelet  3. Vascular dementia, without behavioral disturbance -moderate to severe;to be on namzaric now, but unclear if she is getting this yet as her daughter with her does not know--other daughter gives meds  Labs/tests ordered: Orders Placed This Encounter  Procedures  . CBC With differential/Platelet  . SLP modified barium swallow    Standing Status: Future     Number of Occurrences:      Standing Expiration Date: 11/21/2014    Next appt:  Keep as scheduled

## 2013-11-20 NOTE — Patient Instructions (Signed)
Please pick up voltaren gel at pharmacy for hand pain  Cont soft foods at least until we get results of the swallow study I ordered  Make sure she is taking namzaric

## 2013-11-21 ENCOUNTER — Other Ambulatory Visit (HOSPITAL_COMMUNITY): Payer: Self-pay | Admitting: Internal Medicine

## 2013-11-21 DIAGNOSIS — R1310 Dysphagia, unspecified: Secondary | ICD-10-CM

## 2013-11-21 LAB — CBC WITH DIFFERENTIAL
Basophils Absolute: 0 10*3/uL (ref 0.0–0.2)
Basos: 0 %
Eos: 1 %
Eosinophils Absolute: 0 10*3/uL (ref 0.0–0.4)
HCT: 38 % (ref 34.0–46.6)
Hemoglobin: 12.7 g/dL (ref 11.1–15.9)
Immature Grans (Abs): 0 10*3/uL (ref 0.0–0.1)
Immature Granulocytes: 0 %
Lymphocytes Absolute: 0.8 10*3/uL (ref 0.7–3.1)
Lymphs: 25 %
MCH: 28.7 pg (ref 26.6–33.0)
MCHC: 33.4 g/dL (ref 31.5–35.7)
MCV: 86 fL (ref 79–97)
Monocytes Absolute: 0.3 10*3/uL (ref 0.1–0.9)
Monocytes: 10 %
Neutrophils Absolute: 2 10*3/uL (ref 1.4–7.0)
Neutrophils Relative %: 64 %
Platelets: 168 10*3/uL (ref 150–379)
RBC: 4.42 x10E6/uL (ref 3.77–5.28)
RDW: 13.7 % (ref 12.3–15.4)
WBC: 3.2 10*3/uL — ABNORMAL LOW (ref 3.4–10.8)

## 2013-12-03 ENCOUNTER — Ambulatory Visit (HOSPITAL_COMMUNITY)
Admission: RE | Admit: 2013-12-03 | Discharge: 2013-12-03 | Disposition: A | Discharge status: Home / Self Care | Payer: Medicare Other | Source: Ambulatory Visit | Attending: Internal Medicine | Admitting: Internal Medicine

## 2013-12-03 DIAGNOSIS — F0280 Dementia in other diseases classified elsewhere without behavioral disturbance: Secondary | ICD-10-CM | POA: Insufficient documentation

## 2013-12-03 DIAGNOSIS — K449 Diaphragmatic hernia without obstruction or gangrene: Secondary | ICD-10-CM | POA: Diagnosis not present

## 2013-12-03 DIAGNOSIS — R1313 Dysphagia, pharyngeal phase: Secondary | ICD-10-CM | POA: Insufficient documentation

## 2013-12-03 DIAGNOSIS — E079 Disorder of thyroid, unspecified: Secondary | ICD-10-CM | POA: Diagnosis not present

## 2013-12-03 DIAGNOSIS — R1310 Dysphagia, unspecified: Secondary | ICD-10-CM

## 2013-12-03 DIAGNOSIS — K219 Gastro-esophageal reflux disease without esophagitis: Secondary | ICD-10-CM | POA: Insufficient documentation

## 2013-12-03 DIAGNOSIS — G309 Alzheimer's disease, unspecified: Secondary | ICD-10-CM | POA: Insufficient documentation

## 2013-12-03 DIAGNOSIS — I10 Essential (primary) hypertension: Secondary | ICD-10-CM | POA: Diagnosis not present

## 2013-12-03 NOTE — Procedures (Signed)
Objective Swallowing Evaluation: Modified Barium Swallowing Study  Patient Details  Name: Jill Gutierrez MRN: [id number] Date of Birth: 02-08-1918  Today's Date: 12/03/2013 Time: 1115-1140 SLP Time Calculation (min): 25 min  Past Medical History:  Past Medical History  Diagnosis Date  . Hypertension   . Thyroid disease   . Dementia   . Screening for lipoid disorders   . Alzheimer's disease   . Loss of weight   . Malignant neoplasm of thyroid gland   . Benign neoplasm of colon   . Unspecified glaucoma   . Unspecified cataract   . Unspecified essential hypertension   . Hemorrhoids   . Reflux esophagitis   . Unspecified constipation   . Osteoporosis, unspecified   . Memory loss   . Urge incontinence   . Routine general medical examination at a health care facility   . Malignant neoplasm of thyroid gland   . Unspecified glaucoma   . Unspecified hypothyroidism 06/03/2012   Past Surgical History:  Past Surgical History  Procedure Laterality Date  . Thyroidectomy    . No past surgeries    . Hemorrhoidectomy    . Eye surgery     HPI:  80 yr old patient seen for outpatient MBS accompanied by her daughter.  PMH:  dementia, GERD, small hiatal hernia, neoplasm of thyroid with throidectomy ("53 yrs ago"), HTN.  Daughter's complaints were difficulty masticating solids and expecotrating mucous.     Assessment / Plan / Recommendation Clinical Impression  Dysphagia Diagnosis: Mild pharyngeal phase dysphagia Clinical impression: Pt. demonstrated functional laryngeal elevation and closure with one instance of slight laryngeal penetration with thin likely due to decreased sensation leading to delayed swallow initiation (to pyriform sinuses).  Minimal vallecular residue from reduced tongue base retraction.  Brief esophageal scan did not reveal abnormalities (limited trials and not diagnostic tool used for esophageal disorders; radiologist present during exam). Pt./daughter educated re:  importance of small sips due to premature entrance of liquid into pyriform sinuses.  Recommend mechanical soft texture and thin liquids, small sips, stay upright 45 minutes after meals, alternate liquids/solids.     Treatment Recommendation  No treatment recommended at this time    Diet Recommendation Dysphagia 3 (Mechanical Soft);Thin liquid   Liquid Administration via: Straw (pt./daughter states she never uses straws) Medication Administration: Whole meds with liquid (educated on whole w/ applesauce if liquids become difficult) Supervision: Patient able to self feed;Full supervision/cueing for compensatory strategies Compensations: Slow rate;Small sips/bites;Follow solids with liquid Postural Changes and/or Swallow Maneuvers: Seated upright 90 degrees;Upright 30-60 min after meal    Other  Recommendations Oral Care Recommendations: Oral care BID   Follow Up Recommendations  None    Frequency and Duration        Pertinent Vitals/Pain No pain           Reason for Referral Objectively evaluate swallowing function   Oral Phase Oral Preparation/Oral Phase Oral Phase: Impaired Oral - Solids Oral - Regular: Delayed oral transit   Pharyngeal Phase Pharyngeal Phase Pharyngeal Phase: Impaired Pharyngeal - Thin Pharyngeal - Thin Cup: Delayed swallow initiation;Premature spillage to pyriform sinuses;Penetration/Aspiration during swallow;Pharyngeal residue - pyriform sinuses;Pharyngeal residue - valleculae;Reduced tongue base retraction;Reduced laryngeal elevation (minimal residue) Penetration/Aspiration details (thin cup): Material enters airway, remains ABOVE vocal cords then ejected out (flash x 1) Pharyngeal - Solids Pharyngeal - Regular: Within functional limits  Cervical Esophageal Phase    GO    Cervical Esophageal Phase Cervical Esophageal Phase: [facility]    Functional Assessment Tool  Used:  (skilled clinical judgement) Functional Limitations: Swallowing Swallow Current  Status [phone]): At least 1 percent but less than 20 percent impaired, limited or restricted Swallow Goal Status [phone]): At least 1 percent but less than 20 percent impaired, limited or restricted Swallow Discharge Status [phone]): At least 1 percent but less than 20 percent impaired, limited or restricted    [name] 12/03/2013, 12:24 PM  [name] [name].Ed [employer] [phone]

## 2013-12-22 ENCOUNTER — Other Ambulatory Visit: Payer: Self-pay | Admitting: Internal Medicine

## 2014-03-01 ENCOUNTER — Other Ambulatory Visit: Payer: Self-pay | Admitting: Internal Medicine

## 2014-04-23 ENCOUNTER — Encounter: Payer: Self-pay | Admitting: Internal Medicine

## 2014-04-23 ENCOUNTER — Ambulatory Visit (INDEPENDENT_AMBULATORY_CARE_PROVIDER_SITE_OTHER): Payer: Medicare Other | Admitting: Internal Medicine

## 2014-04-23 VITALS — BP 116/62 | HR 61 | Temp 96.5°F | Resp 18 | Ht 60.0 in | Wt 146.0 lb

## 2014-04-23 DIAGNOSIS — Z23 Encounter for immunization: Secondary | ICD-10-CM

## 2014-04-23 DIAGNOSIS — M810 Age-related osteoporosis without current pathological fracture: Secondary | ICD-10-CM

## 2014-04-23 DIAGNOSIS — H409 Unspecified glaucoma: Secondary | ICD-10-CM

## 2014-04-23 DIAGNOSIS — G629 Polyneuropathy, unspecified: Secondary | ICD-10-CM

## 2014-04-23 DIAGNOSIS — R3981 Functional urinary incontinence: Secondary | ICD-10-CM

## 2014-04-23 DIAGNOSIS — R1310 Dysphagia, unspecified: Secondary | ICD-10-CM

## 2014-04-23 DIAGNOSIS — E039 Hypothyroidism, unspecified: Secondary | ICD-10-CM

## 2014-04-23 DIAGNOSIS — I10 Essential (primary) hypertension: Secondary | ICD-10-CM

## 2014-04-23 DIAGNOSIS — M25579 Pain in unspecified ankle and joints of unspecified foot: Secondary | ICD-10-CM

## 2014-04-23 DIAGNOSIS — F0150 Vascular dementia without behavioral disturbance: Secondary | ICD-10-CM

## 2014-04-23 MED ORDER — TETANUS-DIPHTH-ACELL PERTUSSIS 5-2.5-18.5 LF-MCG/0.5 IM SUSP: 0.5000 mL | Freq: Once | INTRAMUSCULAR | Status: DC

## 2014-04-23 MED ORDER — LEVOTHYROXINE SODIUM 75 MCG PO TABS: ORAL_TABLET | ORAL | Status: DC

## 2014-04-23 MED ORDER — LATANOPROST 0.005 % OP SOLN: 1.0000 [drp] | Freq: Every day | OPHTHALMIC | Status: DC

## 2014-04-23 MED ORDER — HYDROCHLOROTHIAZIDE 25 MG PO TABS: ORAL_TABLET | ORAL | Status: DC

## 2014-04-23 MED ORDER — GABAPENTIN 100 MG PO CAPS: 100.0000 mg | ORAL_CAPSULE | Freq: Three times a day (TID) | ORAL | Status: DC

## 2014-04-23 MED ORDER — ZOSTER VACCINE LIVE 19400 UNT/0.65ML ~~LOC~~ SOLR: 0.6500 mL | Freq: Once | SUBCUTANEOUS | Status: DC

## 2014-04-23 MED ORDER — MEMANTINE HCL-DONEPEZIL HCL ER 28-10 MG PO CP24: 1.0000 | ORAL_CAPSULE | Freq: Every day | ORAL | Status: DC

## 2014-04-23 NOTE — Patient Instructions (Addendum)
Try gabapentin 100mg  by mouth three times daily for foot pain.  Try a neck pillow to keep her head propped up when she is sitting and trying to hold it up.

## 2014-04-23 NOTE — Progress Notes (Signed)
Patient ID: Jill Gutierrez, female   DOB: [birth year], 81 y.o.   MRN: [id number]   Location:  [facility] / [place]  No Known Allergies  Chief Complaint  Patient presents with  . Medical Management of Chronic Issues    6 month follow-up on dementia, no recent labs. Patient with ongoing concerns with leg/foot pain   . Anorexia    Patient not hardly eating due to trouble swallowing   . Immunizations    RX printed for TDaP and shingles vaccine, discuss the need for Prevnar. Patient's daughter thinks she had the flu vaccine (not certain)     HPI: Patient is a 81 y.o. black female seen in the office today for follow up of chronic medical problems accompanied by her daughter Jill Gutierrez.   Complains today of her feet and arm hurting. Jill Gutierrez says this is ongoing. She obtained and is using the diclofenac gel for her hands. Her neck is also sore. Jill Gutierrez says she holds her head in an odd position when she is resting in the bed. Holds her head up instead of relaxing it back.   Not eating due to difficulty swallowing. She takes bites of food and spits it out. Able to swallow medications and liquids without difficulty. Weight is down three pounds today.  Left lower lateral abdominal pain. Denies constipation, diarrhea, melena, nausea, vomiting, dysuria, hematuria. Does not worsen with moving. Hurts all the time.   No changes in her memory. Jill Gutierrez believes she is taking the Namzaric.  Reports she is taking Fosamax weekly for osteoporosis. She is due for a dexa scan.   Eats soft foods.  Cannot eat broccoli or mac and cheese anymore.  Likes spinach.  Can eat sweets well.  Review of Systems:  Review of Systems  Constitutional: Negative for fever, chills and weight loss.  Respiratory: Negative for cough, hemoptysis, sputum production and shortness of breath.   Cardiovascular: Negative for chest pain, palpitations and orthopnea.  Gastrointestinal: Negative for heartburn, nausea,  vomiting, abdominal pain, diarrhea and constipation.  Genitourinary: Negative for dysuria, urgency and frequency.  Musculoskeletal: Positive for myalgias and joint pain.  Neurological: Negative for tremors.  Psychiatric/Behavioral: Positive for memory loss.     Past Medical History  Diagnosis Date  . Hypertension   . Thyroid disease   . Dementia   . Screening for lipoid disorders   . Alzheimer's disease   . Loss of weight   . Malignant neoplasm of thyroid gland   . Benign neoplasm of colon   . Unspecified glaucoma   . Unspecified cataract   . Unspecified essential hypertension   . Hemorrhoids   . Reflux esophagitis   . Unspecified constipation   . Osteoporosis, unspecified   . Memory loss   . Urge incontinence   . Routine general medical examination at a health care facility   . Malignant neoplasm of thyroid gland   . Unspecified glaucoma   . Unspecified hypothyroidism 06/03/2012    Past Surgical History  Procedure Laterality Date  . Thyroidectomy    . No past surgeries    . Hemorrhoidectomy    . Eye surgery      Social History:   reports that she has never smoked. She does not have any smokeless tobacco history on file. She reports that she does not drink alcohol or use illicit drugs.  Family History  Problem Relation Age of Onset  . Diabetes Father     Medications: Patient's Medications  New  Prescriptions   No medications on file  Previous Medications   DICLOFENAC SODIUM (VOLTAREN) 1 % GEL    Apply to hands as needed.   HYDROCHLOROTHIAZIDE (HYDRODIURIL) 25 MG TABLET    TAKE ONE TABLET BY MOUTH ONCE DAILY   LATANOPROST (XALATAN) 0.005 % OPHTHALMIC SOLUTION    Place 1 drop into both eyes daily. Place 1 drop into both eyes daily for glaucoma.   LEVOTHYROXINE (SYNTHROID, LEVOTHROID) 75 MCG TABLET    TAKE ONE TABLET BY MOUTH ONCE DAILY BEFORE  BREAKFAST   MEMANTINE HCL-DONEPEZIL HCL (NAMZARIC) 28-10 MG CP24    Take 1 capsule by mouth daily. Opened and sprinkled  in food   TDAP (BOOSTRIX) 5-2.5-18.5 LF-MCG/0.5 INJECTION    Inject 0.5 mLs into the muscle once.   ZOSTER VACCINE LIVE, PF, (ZOSTAVAX) 09811 UNT/0.65ML INJECTION    Inject 0.65 mLs into the skin once.  Modified Medications   No medications on file  Discontinued Medications   ALENDRONATE (FOSAMAX) 70 MG TABLET    TAKE ONE TABLET BY MOUTH ONCE A WEEK     Physical Exam: Filed Vitals:   04/23/14 1434  BP: 116/62  Pulse: 61  Temp: 96.5 F (35.8 C)  TempSrc: Axillary  Height: 5' (1.524 m)  Weight: 146 lb (66.225 kg)  SpO2: 98%  Physical Exam  Neck: Normal range of motion. Neck supple.  Cardiovascular: Normal rate, regular rhythm, normal heart sounds and intact distal pulses.  Exam reveals no gallop and no friction rub.   No murmur heard. Pulmonary/Chest: Effort normal and breath sounds normal. No respiratory distress. She has no wheezes. She has no rales. She exhibits no tenderness.  Abdominal: Soft. Bowel sounds are normal. She exhibits no distension and no mass. There is tenderness. There is no rebound and no guarding. No hernia.  Musculoskeletal:  Weak, in wheelchair  Lymphadenopathy:    She has no cervical adenopathy.  Neurological: She is alert.  Skin: Skin is warm and dry. No erythema.  Psychiatric: She has a normal mood and affect.  Vitals reviewed.   Labs reviewed: Basic Metabolic Panel:  Recent Labs  10/20/13 1542  NA 145*  K 3.7  CL 102  CO2 25  GLUCOSE 83  BUN 15  CREATININE 1.15*  CALCIUM 9.3  TSH 0.274*   Liver Function Tests:  Recent Labs  10/20/13 1542  AST 18  ALT 7  ALKPHOS 73  BILITOT 0.4  PROT 6.6   No results for input(s): LIPASE, AMYLASE in the last 8760 hours. No results for input(s): AMMONIA in the last 8760 hours. CBC:  Recent Labs  10/20/13 1542 11/20/13 1559  WBC 3.8 3.2*  NEUTROABS 2.4 2.0  HGB 12.6 12.7  HCT 39.5 38.0  MCV 90 86  PLT 156 168    Assessment/Plan 1. Essential hypertension, benign - bp at goal with  current meds, refills sent for all meds at family request - hydrochlorothiazide (HYDRODIURIL) 25 MG tablet; TAKE ONE TABLET BY MOUTH ONCE DAILY  Dispense: 90 tablet; Refill: 1  2. Vascular dementia, without behavioral disturbance - Memantine HCl-Donepezil HCl (NAMZARIC) 28-10 MG CP24; Take 1 capsule by mouth daily. Opened and sprinkled in food  Dispense: 90 capsule; Refill: 1  3. Glaucoma - last eye visit 3-4 mos ago and due gain soon - latanoprost (XALATAN) 0.005 % ophthalmic solution; Place 1 drop into both eyes daily. Place 1 drop into both eyes daily for glaucoma.  Dispense: 2.5 mL; Refill: 11  4. Urinary incontinence due to cognitive impairment -not  on meds, cont depends, not a good candidate for anticholinergic meds--I think I suggested myrbetriq and gave samples, but family did not follow up  5. Senile osteoporosis -I have started  Vitamin D 2000 units more than once, but it disappears from what she is taking each visit  6. Dysphagia -cont mechanical soft foods based on results of her barium swallow  7. Hypothyroidism, unspecified hypothyroidism type -cont synthroid 64mcg daily and f/u tsh today  8. Pain in joint, ankle and foot, unspecified laterality -ongoing, has bilateral foot drop and chronic pain  9.. Peripheral neuropathy:  Try gabapentin for pain due to fact that it sounds neuropathic  Labs/tests ordered: Orders Placed This Encounter  Procedures  . CBC with Differential/Platelet  . Comprehensive metabolic panel  . TSH   Pt refuses all vaccines after Rxs printed for tdap and zoster  Next appt:  3 mos  Courtez Twaddle L. Margaretann Abate, D.O. [facility] [address], [place] [address] Cell Phone (Mon-Fri 8am-5pm):  [phone] On Call:  [phone] & follow prompts after 5pm & weekends Office Phone:  [phone] Office Fax:  [phone]

## 2014-04-24 ENCOUNTER — Other Ambulatory Visit: Payer: Self-pay | Admitting: *Deleted

## 2014-04-24 DIAGNOSIS — E039 Hypothyroidism, unspecified: Secondary | ICD-10-CM

## 2014-04-24 LAB — CBC WITH DIFFERENTIAL/PLATELET
Basophils Absolute: 0 10*3/uL (ref 0.0–0.2)
Basos: 0 %
Eos: 2 %
Eosinophils Absolute: 0.1 10*3/uL (ref 0.0–0.4)
HCT: 39.1 % (ref 34.0–46.6)
Hemoglobin: 12.9 g/dL (ref 11.1–15.9)
Immature Grans (Abs): 0 10*3/uL (ref 0.0–0.1)
Immature Granulocytes: 0 %
Lymphocytes Absolute: 1 10*3/uL (ref 0.7–3.1)
Lymphs: 25 %
MCH: 28.7 pg (ref 26.6–33.0)
MCHC: 33 g/dL (ref 31.5–35.7)
MCV: 87 fL (ref 79–97)
Monocytes Absolute: 0.5 10*3/uL (ref 0.1–0.9)
Monocytes: 11 %
Neutrophils Absolute: 2.6 10*3/uL (ref 1.4–7.0)
Neutrophils Relative %: 62 %
Platelets: 155 10*3/uL (ref 150–379)
RBC: 4.49 x10E6/uL (ref 3.77–5.28)
RDW: 13.9 % (ref 12.3–15.4)
WBC: 4.2 10*3/uL (ref 3.4–10.8)

## 2014-04-24 LAB — TSH: TSH: 0.056 u[IU]/mL — ABNORMAL LOW (ref 0.450–4.500)

## 2014-04-24 LAB — COMPREHENSIVE METABOLIC PANEL
ALT: 7 IU/L (ref 0–32)
AST: 18 IU/L (ref 0–40)
Albumin/Globulin Ratio: 1.6 (ref 1.1–2.5)
Albumin: 4.1 g/dL (ref 3.2–4.6)
Alkaline Phosphatase: 88 IU/L (ref 39–117)
BUN/Creatinine Ratio: 15 (ref 11–26)
BUN: 18 mg/dL (ref 10–36)
Bilirubin Total: 0.3 mg/dL (ref 0.0–1.2)
CO2: 27 mmol/L (ref 18–29)
Calcium: 9.3 mg/dL (ref 8.7–10.3)
Chloride: 101 mmol/L (ref 97–108)
Creatinine, Ser: 1.23 mg/dL — ABNORMAL HIGH (ref 0.57–1.00)
GFR calc Af Amer: 43 mL/min/{1.73_m2} — ABNORMAL LOW (ref >59)
GFR calc non Af Amer: 38 mL/min/{1.73_m2} — ABNORMAL LOW (ref >59)
Globulin, Total: 2.6 g/dL (ref 1.5–4.5)
Glucose: 90 mg/dL (ref 65–99)
Potassium: 3.6 mmol/L (ref 3.5–5.2)
Sodium: 144 mmol/L (ref 134–144)
Total Protein: 6.7 g/dL (ref 6.0–8.5)

## 2014-04-24 MED ORDER — LEVOTHYROXINE SODIUM 50 MCG PO TABS: ORAL_TABLET | ORAL | Status: DC

## 2014-04-27 ENCOUNTER — Other Ambulatory Visit: Payer: Self-pay | Admitting: *Deleted

## 2014-04-27 MED ORDER — LEVOTHYROXINE SODIUM 50 MCG PO TABS: ORAL_TABLET | ORAL | Status: DC

## 2014-04-27 NOTE — Telephone Encounter (Signed)
[facility]

## 2014-06-05 ENCOUNTER — Other Ambulatory Visit: Payer: Self-pay

## 2014-07-23 ENCOUNTER — Ambulatory Visit: Payer: Medicare Other | Admitting: Internal Medicine

## 2014-10-27 ENCOUNTER — Telehealth: Payer: Self-pay

## 2014-10-27 NOTE — Telephone Encounter (Signed)
Ok, I cannot be there tomorrow when I have clinic at [facility].  Please discuss with Chrae about what to do next.

## 2014-10-27 NOTE — Telephone Encounter (Signed)
Patients daughter called said her mother is having problems with sores on her arms and feet that look like bruises and are very painful they would like to bring there mother in tomorrow to be seen for this but Dr. Eulas Post is booked and Dr. Nyoka Cowden Please advise

## 2014-10-27 NOTE — Telephone Encounter (Signed)
Schedule her to see me Thursday.  Use a same day if needed.

## 2014-10-27 NOTE — Telephone Encounter (Signed)
She says it has to be tomorrow after 2:30 because it is the only time her sister can bring her

## 2014-10-28 NOTE — Telephone Encounter (Signed)
error 

## 2014-10-28 NOTE — Telephone Encounter (Signed)
Dr. Eulas Post this patient is Dr. Magdalene Molly patient she can only come in today after 2:30 please advise if she can be worked in.

## 2014-10-29 ENCOUNTER — Ambulatory Visit (INDEPENDENT_AMBULATORY_CARE_PROVIDER_SITE_OTHER): Payer: Medicare Other | Admitting: Internal Medicine

## 2014-10-29 ENCOUNTER — Encounter: Payer: Self-pay | Admitting: Internal Medicine

## 2014-10-29 VITALS — BP 120/78 | HR 64 | Temp 96.3°F | Resp 18 | Ht 60.0 in | Wt 146.0 lb

## 2014-10-29 DIAGNOSIS — G629 Polyneuropathy, unspecified: Secondary | ICD-10-CM | POA: Diagnosis not present

## 2014-10-29 DIAGNOSIS — I10 Essential (primary) hypertension: Secondary | ICD-10-CM

## 2014-10-29 DIAGNOSIS — B354 Tinea corporis: Secondary | ICD-10-CM | POA: Diagnosis not present

## 2014-10-29 DIAGNOSIS — E039 Hypothyroidism, unspecified: Secondary | ICD-10-CM

## 2014-10-29 DIAGNOSIS — M1990 Unspecified osteoarthritis, unspecified site: Secondary | ICD-10-CM

## 2014-10-29 DIAGNOSIS — F0150 Vascular dementia without behavioral disturbance: Secondary | ICD-10-CM | POA: Diagnosis not present

## 2014-10-29 DIAGNOSIS — H409 Unspecified glaucoma: Secondary | ICD-10-CM | POA: Diagnosis not present

## 2014-10-29 DIAGNOSIS — E785 Hyperlipidemia, unspecified: Secondary | ICD-10-CM

## 2014-10-29 DIAGNOSIS — B369 Superficial mycosis, unspecified: Secondary | ICD-10-CM | POA: Insufficient documentation

## 2014-10-29 DIAGNOSIS — R3981 Functional urinary incontinence: Secondary | ICD-10-CM

## 2014-10-29 DIAGNOSIS — M810 Age-related osteoporosis without current pathological fracture: Secondary | ICD-10-CM | POA: Diagnosis not present

## 2014-10-29 MED ORDER — GABAPENTIN 100 MG PO CAPS: 100.0000 mg | ORAL_CAPSULE | Freq: Three times a day (TID) | ORAL | Status: DC

## 2014-10-29 MED ORDER — HYDROCHLOROTHIAZIDE 25 MG PO TABS: ORAL_TABLET | ORAL | Status: DC

## 2014-10-29 MED ORDER — LEVOTHYROXINE SODIUM 50 MCG PO TABS: ORAL_TABLET | ORAL | Status: DC

## 2014-10-29 MED ORDER — LATANOPROST 0.005 % OP SOLN: 1.0000 [drp] | Freq: Every day | OPHTHALMIC | Status: DC

## 2014-10-29 MED ORDER — MEMANTINE HCL-DONEPEZIL HCL ER 28-10 MG PO CP24: 1.0000 | ORAL_CAPSULE | Freq: Every day | ORAL | Status: DC

## 2014-10-29 MED ORDER — CLOTRIMAZOLE-BETAMETHASONE 1-0.05 % EX CREA: 1.0000 "application " | TOPICAL_CREAM | Freq: Two times a day (BID) | CUTANEOUS | Status: DC

## 2014-10-29 MED ORDER — DICLOFENAC SODIUM 1 % TD GEL: TRANSDERMAL | Status: DC

## 2014-10-29 NOTE — Progress Notes (Signed)
Location:  [facility]  Code Status: Full Code Goals of Care: Advanced Directive information     Chief Complaint  Patient presents with  . Medical Management of Chronic Issues    sores on right arm    HPI: Patient is a 81 y.o. AA female seen in the office today for an acute visit for sores on her arm. She is accompanied by her daughter [name].  Has one scaly patch on her R forearm, about the size of a quarter. States it has been there for several weeks. Started out as a red spot and changed to scaly. Itches a lot.   Her hands and feet are still hurting. She is only taking her gabapentin once a day.   She is having trouble holding her urine. Having urgency. A little burning also. Lower left abdominal pain. Denies fever, chills, hematuria.     She did not return for recheck of TSH in April. Will recheck today  Review of Systems:  Review of Systems  Constitutional: Negative for fever, chills and malaise/fatigue.  Respiratory: Negative for cough and shortness of breath.   Cardiovascular: Negative for chest pain, palpitations and orthopnea.  Gastrointestinal: Positive for abdominal pain. Negative for nausea and vomiting.  Genitourinary: Positive for dysuria and urgency.  Musculoskeletal: Negative for myalgias and joint pain.  Skin: Positive for itching and rash.  Neurological: Positive for tingling. Negative for headaches.  Psychiatric/Behavioral: Positive for memory loss.    Past Medical History  Diagnosis Date  . Hypertension   . Thyroid disease   . Dementia   . Screening for lipoid disorders   . Alzheimer's disease   . Loss of weight   . Malignant neoplasm of thyroid gland   . Benign neoplasm of colon   . Unspecified glaucoma   . Unspecified cataract   . Unspecified essential hypertension   . Hemorrhoids   . Reflux esophagitis   . Unspecified constipation   . Osteoporosis, unspecified   . Memory loss   . Urge  incontinence   . Routine general medical examination at a health care facility   . Malignant neoplasm of thyroid gland   . Unspecified glaucoma   . Unspecified hypothyroidism 06/03/2012    Past Surgical History  Procedure Laterality Date  . Thyroidectomy    . No past surgeries    . Hemorrhoidectomy    . Eye surgery      No Known Allergies Medications: Patient's Medications  New Prescriptions   No medications on file  Previous Medications   DICLOFENAC SODIUM (VOLTAREN) 1 % GEL    Apply to hands as needed.   GABAPENTIN (NEURONTIN) 100 MG CAPSULE    Take 1 capsule (100 mg total) by mouth 3 (three) times daily.   HYDROCHLOROTHIAZIDE (HYDRODIURIL) 25 MG TABLET    TAKE ONE TABLET BY MOUTH ONCE DAILY   LATANOPROST (XALATAN) 0.005 % OPHTHALMIC SOLUTION    Place 1 drop into both eyes daily. Place 1 drop into both eyes daily for glaucoma.   LEVOTHYROXINE (SYNTHROID, LEVOTHROID) 50 MCG TABLET    Take one tablet by mouth 30 minutes before breakfast on empty stomach for thyroid   MEMANTINE HCL-DONEPEZIL HCL (NAMZARIC) 28-10 MG CP24    Take 1 capsule by mouth daily. Opened and sprinkled in food   TDAP (BOOSTRIX) 5-2.5-18.5 LF-MCG/0.5 INJECTION    Inject 0.5 mLs into the muscle once.   ZOSTER VACCINE LIVE, PF, (ZOSTAVAX) 02725 UNT/0.65ML INJECTION    Inject  19,400 Units into the skin once.  Modified Medications   No medications on file  Discontinued Medications   No medications on file    Physical Exam: There were no vitals filed for this visit. Physical Exam  Constitutional: She appears well-developed and well-nourished.  HENT:  Head: Normocephalic and atraumatic.  Neck: Normal range of motion.  Cardiovascular: Normal rate and regular rhythm.   Murmur heard. Pulmonary/Chest: Effort normal and breath sounds normal. She has no wheezes. She has no rales.  Abdominal: Soft. Bowel sounds are normal. She exhibits no distension. There is no tenderness.  Musculoskeletal: She exhibits no edema  or tenderness.  In wheechair, difficulty with ambulation  Neurological: She is alert.  Memory and cognitive deficit, hearing deficit  Skin: Skin is warm and dry.  Quarter size scaly patch on R medial forearm, pea size red satellite lesion noted  Psychiatric: She has a normal mood and affect. Her behavior is normal.  Slow to respond     Labs reviewed: Basic Metabolic Panel:  Recent Labs  04/23/14 1611  NA 144  K 3.6  CL 101  CO2 27  GLUCOSE 90  BUN 18  CREATININE 1.23*  CALCIUM 9.3  TSH 0.056*   Liver Function Tests:  Recent Labs  04/23/14 1611  AST 18  ALT 7  ALKPHOS 88  BILITOT 0.3  PROT 6.7   No results for input(s): LIPASE, AMYLASE in the last 8760 hours. No results for input(s): AMMONIA in the last 8760 hours. CBC:  Recent Labs  11/20/13 1559 04/23/14 1611  WBC 3.2* 4.2  NEUTROABS 2.0 2.6  HGB 12.7 12.9  HCT 38.0 39.1  MCV 86 87  PLT 168 155   Lipid Panel: No results for input(s): CHOL, HDL, LDLCALC, TRIG, CHOLHDL, LDLDIRECT in the last 8760 hours. No results found for: HGBA1C  Procedures since last visit:  Assessment/Plan  1. Peripheral neuropathy- persists. She is not taking her gabapentin as prescribed- only taking it once per day rather than TID. Encouraged patient and daughter to take med as prescribed. - gabapentin (NEURONTIN) 100 MG capsule; Take 1 capsule (100 mg total) by mouth 3 (three) times daily.  Dispense: 90 capsule; Refill: 3 - Basic metabolic panel  2. Essential hypertension, benign- well controlled. Continue current medications. - hydrochlorothiazide (HYDRODIURIL) 25 MG tablet; TAKE ONE TABLET BY MOUTH ONCE DAILY  Dispense: 90 tablet; Refill: 1 - latanoprost (XALATAN) 0.005 % ophthalmic solution; Place 1 drop into both eyes daily. Place 1 drop into both eyes daily for glaucoma.  Dispense: 2.5 mL; Refill: 11 - diclofenac sodium (VOLTAREN) 1 % GEL; Apply to hands as needed.  Dispense: 4 Tube; Refill: 3 - Basic metabolic  panel  3. Glaucoma- continue current meds. Followed by opthamologist. - latanoprost (XALATAN) 0.005 % ophthalmic solution; Place 1 drop into both eyes daily. Place 1 drop into both eyes daily for glaucoma.  Dispense: 2.5 mL; Refill: 11 - diclofenac sodium (VOLTAREN) 1 % GEL; Apply to hands as needed.  Dispense: 4 Tube; Refill: 3  4. Urinary incontinence due to cognitive impairment - burning at times may be related to decreased fluid intake. Encouraged to increase fluids. - latanoprost (XALATAN) 0.005 % ophthalmic solution; Place 1 drop into both eyes daily. Place 1 drop into both eyes daily for glaucoma.  Dispense: 2.5 mL; Refill: 11 - diclofenac sodium (VOLTAREN) 1 % GEL; Apply to hands as needed.  Dispense: 4 Tube; Refill: 3 - Basic metabolic panel  5. Senile osteoporosis- Fall precautions. Continue to monitor  for complications. - latanoprost (XALATAN) 0.005 % ophthalmic solution; Place 1 drop into both eyes daily. Place 1 drop into both eyes daily for glaucoma.  Dispense: 2.5 mL; Refill: 11 - diclofenac sodium (VOLTAREN) 1 % GEL; Apply to hands as needed.  Dispense: 4 Tube; Refill: 3  6. Vascular dementia, without behavioral disturbance- no decline per daughter. Contineu current medications. - Memantine HCl-Donepezil HCl (NAMZARIC) 28-10 MG CP24; Take 1 capsule by mouth daily. Opened and sprinkled in food  Dispense: 90 capsule; Refill: 1  7. Dyslipidemia - Will check lipid levels today. - diclofenac sodium (VOLTAREN) 1 % GEL; Apply to hands as needed.  Dispense: 4 Tube; Refill: 3  8. Arthritis- continue current medications. - diclofenac sodium (VOLTAREN) 1 % GEL; Apply to hands as needed.  Dispense: 4 Tube; Refill: 3  9. Tinea corporis- new onset. Prescription sent to pharmacy.  - clotrimazole-betamethasone (LOTRISONE) cream; Apply 1 application topically 2 (two) times daily. To right arm dry spot until resolved.  Dispense: 15 g; Refill: 1  10. Hypothyroidism, unspecified  hypothyroidism type- She did not return for her last lab check. Will recheck TSH while she is here today and adjust synthroid if needed.  11. Hypothyroidism, adult - TSH   Labs/tests ordered: TSH, Lipid panel  Next appt: Follow up in 3 mths with Dr. Mariea Clonts.  Mariana Kaufman, RN, BSN [employer] Nurse Practitioner- [facility] [address], [place] [address] Office Phone:  [phone] Office Fax:  [phone]

## 2014-10-29 NOTE — Patient Instructions (Addendum)
Take gabapentin three times per day for your feet and hand pain (burning).   We'll give you a cream (sent to pharmacy) for the fungal rash on your right arm.  We rechecked your thyroid and your electrolytes today.  Increase your water intake to 6 8oz glasses per day.

## 2014-10-30 LAB — BASIC METABOLIC PANEL
BUN/Creatinine Ratio: 9 — ABNORMAL LOW (ref 11–26)
BUN: 12 mg/dL (ref 10–36)
CO2: 27 mmol/L (ref 18–29)
Calcium: 9.2 mg/dL (ref 8.7–10.3)
Chloride: 100 mmol/L (ref 97–108)
Creatinine, Ser: 1.36 mg/dL — ABNORMAL HIGH (ref 0.57–1.00)
GFR calc Af Amer: 38 mL/min/{1.73_m2} — ABNORMAL LOW (ref >59)
GFR calc non Af Amer: 33 mL/min/{1.73_m2} — ABNORMAL LOW (ref >59)
Glucose: 111 mg/dL — ABNORMAL HIGH (ref 65–99)
Potassium: 3.5 mmol/L (ref 3.5–5.2)
Sodium: 143 mmol/L (ref 134–144)

## 2014-10-30 LAB — TSH: TSH: 9.53 u[IU]/mL — ABNORMAL HIGH (ref 0.450–4.500)

## 2014-12-08 ENCOUNTER — Telehealth: Payer: Self-pay | Admitting: *Deleted

## 2014-12-08 NOTE — Telephone Encounter (Signed)
Patient caregiver, [name] called and stated that she received patient's labs in the mail and patient DOES take her Thyroid medication as directed. She wants to know why her medication was reduced when now its abnormal and patient takes it everyday. Please Advise.

## 2014-12-08 NOTE — Telephone Encounter (Signed)
Ok.    Dose was not changed b/c I did not believe she was taking it as directed at the time I saw the result.  Later notes on the lab indicate that she is taking it properly so will increase synthroid dose to 83mcg daily first thing in the morning on an empty stomach separate from other pills.  She should be supervised swallowing her pills b/c she does not remember and does not like to take pills.

## 2014-12-11 NOTE — Telephone Encounter (Signed)
No Answer, will try again

## 2014-12-14 NOTE — Telephone Encounter (Signed)
LMOM to return call.

## 2014-12-17 NOTE — Telephone Encounter (Signed)
Number Busy times 2

## 2014-12-22 MED ORDER — LEVOTHYROXINE SODIUM 75 MCG PO TABS: ORAL_TABLET | ORAL | Status: DC

## 2014-12-22 NOTE — Telephone Encounter (Signed)
[name], caregiver notified and agreed. Faxed Rx to [facility]

## 2015-01-21 ENCOUNTER — Ambulatory Visit: Payer: Medicare Other | Admitting: Internal Medicine

## 2015-03-19 ENCOUNTER — Encounter (HOSPITAL_COMMUNITY): Payer: Self-pay | Admitting: Emergency Medicine

## 2015-03-19 ENCOUNTER — Emergency Department (HOSPITAL_COMMUNITY): Payer: Medicare Other

## 2015-03-19 ENCOUNTER — Emergency Department (HOSPITAL_COMMUNITY)
Admission: EM | Admit: 2015-03-19 | Discharge: 2015-03-20 | Disposition: A | Discharge status: Home / Self Care | Payer: Medicare Other | Attending: Emergency Medicine | Admitting: Emergency Medicine

## 2015-03-19 DIAGNOSIS — Z86018 Personal history of other benign neoplasm: Secondary | ICD-10-CM | POA: Diagnosis not present

## 2015-03-19 DIAGNOSIS — I10 Essential (primary) hypertension: Secondary | ICD-10-CM | POA: Insufficient documentation

## 2015-03-19 DIAGNOSIS — Y9289 Other specified places as the place of occurrence of the external cause: Secondary | ICD-10-CM | POA: Diagnosis not present

## 2015-03-19 DIAGNOSIS — Y9389 Activity, other specified: Secondary | ICD-10-CM | POA: Insufficient documentation

## 2015-03-19 DIAGNOSIS — Z7952 Long term (current) use of systemic steroids: Secondary | ICD-10-CM | POA: Diagnosis not present

## 2015-03-19 DIAGNOSIS — F0280 Dementia in other diseases classified elsewhere without behavioral disturbance: Secondary | ICD-10-CM | POA: Insufficient documentation

## 2015-03-19 DIAGNOSIS — S79911A Unspecified injury of right hip, initial encounter: Secondary | ICD-10-CM | POA: Diagnosis present

## 2015-03-19 DIAGNOSIS — Z8719 Personal history of other diseases of the digestive system: Secondary | ICD-10-CM | POA: Insufficient documentation

## 2015-03-19 DIAGNOSIS — W1839XA Other fall on same level, initial encounter: Secondary | ICD-10-CM | POA: Diagnosis not present

## 2015-03-19 DIAGNOSIS — S7001XA Contusion of right hip, initial encounter: Secondary | ICD-10-CM | POA: Diagnosis not present

## 2015-03-19 DIAGNOSIS — E079 Disorder of thyroid, unspecified: Secondary | ICD-10-CM | POA: Insufficient documentation

## 2015-03-19 DIAGNOSIS — H409 Unspecified glaucoma: Secondary | ICD-10-CM | POA: Diagnosis not present

## 2015-03-19 DIAGNOSIS — Z79899 Other long term (current) drug therapy: Secondary | ICD-10-CM | POA: Insufficient documentation

## 2015-03-19 DIAGNOSIS — Y998 Other external cause status: Secondary | ICD-10-CM | POA: Diagnosis not present

## 2015-03-19 DIAGNOSIS — R52 Pain, unspecified: Secondary | ICD-10-CM

## 2015-03-19 DIAGNOSIS — S7011XA Contusion of right thigh, initial encounter: Secondary | ICD-10-CM | POA: Diagnosis not present

## 2015-03-19 DIAGNOSIS — Z8739 Personal history of other diseases of the musculoskeletal system and connective tissue: Secondary | ICD-10-CM | POA: Insufficient documentation

## 2015-03-19 DIAGNOSIS — G309 Alzheimer's disease, unspecified: Secondary | ICD-10-CM | POA: Diagnosis not present

## 2015-03-19 DIAGNOSIS — Z85850 Personal history of malignant neoplasm of thyroid: Secondary | ICD-10-CM | POA: Diagnosis not present

## 2015-03-19 DIAGNOSIS — S99912A Unspecified injury of left ankle, initial encounter: Secondary | ICD-10-CM | POA: Insufficient documentation

## 2015-03-19 NOTE — ED Notes (Signed)
Family reports unwitnessed fall. Denies LOC, denies anticoagulants, c/o right hip and left ankle pain.

## 2015-03-19 NOTE — Progress Notes (Signed)
CSW was notified by nurse that family may be trying to leave patient in the [facility].  CSW met with son and pt in conference room. CSW informed son that he cannot leave patient in [facility] and that it would not be appropriate for shelter reasons. Son expressed understanding. Both son and patient were cooperative.   CSW made nurse aware of conversation.  Son/John Stanco [phone]   , LCSWA [phone] ED CSW 03/19/2015 11:25 PM    

## 2015-03-20 LAB — URINALYSIS, ROUTINE W REFLEX MICROSCOPIC
Bilirubin Urine: NEGATIVE
GLUCOSE, UA: NEGATIVE mg/dL
KETONES UR: NEGATIVE mg/dL
NITRITE: NEGATIVE
PH: 6.5 (ref 5.0–8.0)
Protein, ur: NEGATIVE mg/dL
SPECIFIC GRAVITY, URINE: 1.015 (ref 1.005–1.030)

## 2015-03-20 LAB — URINE MICROSCOPIC-ADD ON

## 2015-03-20 MED ORDER — ACETAMINOPHEN 500 MG PO TABS
1000.0000 mg | ORAL_TABLET | Freq: Once | ORAL | Status: AC
  Administered 2015-03-20: 1000 mg via ORAL
  Filled 2015-03-20: qty 2

## 2015-03-20 MED ORDER — ACETAMINOPHEN 500 MG PO TABS: 1000.0000 mg | ORAL_TABLET | Freq: Four times a day (QID) | ORAL | Status: DC | PRN

## 2015-03-20 NOTE — ED Provider Notes (Signed)
CSN: [id number]     Arrival date & time 03/19/15  2058 History  By signing my name below, I, Jill Gutierrez, attest that this documentation has been prepared under the direction and in the presence of Leo Grosser, MD. Electronically Signed: Irene Gutierrez, ED Scribe. 03/20/2015. 1:22 AM.  Chief Complaint  Patient presents with  . Fall  . Hip Pain   Patient is a 82 y.o. female presenting with fall and hip pain. The history is provided by the patient. No language interpreter was used.  Fall This is a new problem. The current episode started 6 to 12 hours ago. The problem occurs constantly. She has tried nothing for the symptoms.  Hip Pain This is a new problem. The problem occurs constantly. She has tried nothing for the symptoms.  HPI Comments: Jill Gutierrez is a 82 y.o. Female with a hx of dementia, Alzheimer's disease, HTN, thyroid disease, osteoporosis and memory loss who presents to the Emergency Department complaining of an unwitnessed fall onset two days ago. Pt reports associated gradually worsening, constant right leg pain and left ankle pain that worsens with movement and palpation. Pt is ambulatory with a walker. She has not tried to ambulate since the fall, says "I can't get up." Pt has not taken anything for her pain. Pt denies LOC or use of anti-coagulants. Pt takes Neurontin, hydrodiuril, synthroid and Namzaric. Pt lives with her daughter.  Past Medical History  Diagnosis Date  . Hypertension   . Thyroid disease   . Dementia   . Screening for lipoid disorders   . Alzheimer's disease   . Loss of weight   . Malignant neoplasm of thyroid gland ([place])   . Benign neoplasm of colon   . Unspecified glaucoma   . Unspecified cataract   . Unspecified essential hypertension   . Hemorrhoids   . Reflux esophagitis   . Unspecified constipation   . Osteoporosis, unspecified   . Memory loss   . Urge incontinence   . Routine general medical examination at a health care facility   .  Malignant neoplasm of thyroid gland ([place])   . Unspecified glaucoma   . Unspecified hypothyroidism 06/03/2012   Past Surgical History  Procedure Laterality Date  . Thyroidectomy    . No past surgeries    . Hemorrhoidectomy    . Eye surgery     Family History  Problem Relation Age of Onset  . Diabetes Father    Social History  Substance Use Topics  . Smoking status: Never Smoker   . Smokeless tobacco: None  . Alcohol Use: No   OB History    No data available     Review of Systems  Musculoskeletal: Positive for arthralgias.  Neurological: Negative for syncope.  All other systems reviewed and are negative.  Allergies  Review of patient's allergies indicates no known allergies.  Home Medications   Prior to Admission medications   Medication Sig Start Date End Date Taking? Authorizing Provider  clotrimazole-betamethasone (LOTRISONE) cream Apply 1 application topically 2 (two) times daily. To right arm dry spot until resolved. 10/29/14   Tiffany L Reed, DO  diclofenac sodium (VOLTAREN) 1 % GEL Apply to hands as needed. 10/29/14   Tiffany L Reed, DO  gabapentin (NEURONTIN) 100 MG capsule Take 1 capsule (100 mg total) by mouth 3 (three) times daily. 10/29/14   Tiffany L Reed, DO  hydrochlorothiazide (HYDRODIURIL) 25 MG tablet TAKE ONE TABLET BY MOUTH ONCE DAILY 10/29/14   Gayland Curry,  DO  latanoprost (XALATAN) 0.005 % ophthalmic solution Place 1 drop into both eyes daily. Place 1 drop into both eyes daily for glaucoma. 10/29/14   Tiffany L Reed, DO  levothyroxine (SYNTHROID, LEVOTHROID) 75 MCG tablet Take one tablet by mouth once daily 30 minutes before breakfast on empty stomach for thyroid. 12/22/14   Tiffany L Reed, DO  Memantine HCl-Donepezil HCl (NAMZARIC) 28-10 MG CP24 Take 1 capsule by mouth daily. Opened and sprinkled in food 10/29/14   Tiffany L Reed, DO  Tdap (BOOSTRIX) 5-2.5-18.5 LF-MCG/0.5 injection Inject 0.5 mLs into the muscle once. Patient not taking: Reported on  10/29/2014 04/23/14   Gayland Curry, DO  zoster vaccine live, PF, (ZOSTAVAX) 91478 UNT/0.65ML injection Inject 19,400 Units into the skin once. Patient not taking: Reported on 10/29/2014 04/23/14   Tiffany L Reed, DO   BP 155/80 mmHg  Pulse 74  Temp(Src) 97.6 F (36.4 C) (Oral)  Resp 20  SpO2 99% Physical Exam  Constitutional: She is oriented to person, place, and time. She appears well-developed and well-nourished. No distress.  HENT:  Head: Normocephalic and atraumatic.  Mouth/Throat: Oropharynx is clear and moist. No oropharyngeal exudate.  Trachea midline  Eyes: Conjunctivae and EOM are normal. Pupils are equal, round, and reactive to light.  Neck: Trachea normal and normal range of motion. Neck supple. No JVD present. Carotid bruit is not present.  Cardiovascular: Normal rate and regular rhythm.  Exam reveals no gallop and no friction rub.   No murmur heard. Pulmonary/Chest: Effort normal and breath sounds normal. No stridor. She has no wheezes. She has no rales.  Abdominal: Soft. Bowel sounds are normal. She exhibits no mass. There is no tenderness. There is no rebound and no guarding.  Musculoskeletal: Normal range of motion.  3 cm contusion with surrounding ecchymosis to right lateral hip, no shortening or deformity. Full ROM and strength of right hip  Lymphadenopathy:    She has no cervical adenopathy.  Neurological: She is alert and oriented to person, place, and time. She has normal reflexes. No cranial nerve deficit. She exhibits normal muscle tone. Coordination normal.  Cranial nerves 2-12 intact  Skin: Skin is warm and dry. She is not diaphoretic.  Psychiatric: She has a normal mood and affect. Her behavior is normal.    ED Course  Procedures (including critical care time) DIAGNOSTIC STUDIES: Oxygen Saturation is 99% on RA, normal by my interpretation.    COORDINATION OF CARE: 1:20 AM-Discussed treatment plan which includes pain medication, x-ray and follow up with  physical therapist with pt at bedside and pt agreed to plan.   Labs Review Labs Reviewed  URINALYSIS, ROUTINE W REFLEX MICROSCOPIC (NOT AT [facility]) - Abnormal; Notable for the following:    APPearance CLOUDY (*)    Hgb urine dipstick MODERATE (*)    Leukocytes, UA SMALL (*)    All other components within normal limits  URINE MICROSCOPIC-ADD ON - Abnormal; Notable for the following:    Squamous Epithelial / LPF 0-5 (*)    Bacteria, UA RARE (*)    All other components within normal limits   Imaging Review Dg Ankle Complete Left  03/19/2015  CLINICAL DATA:  Pain following fall EXAM: LEFT ANKLE COMPLETE - 3+ VIEW COMPARISON:  None. FINDINGS: Frontal, oblique, and lateral views obtained. There is no demonstrable fracture or joint effusion. Ankle mortise appears intact. There is generalized joint space narrowing. There are posterior and inferior calcaneal spurs. Bones are diffusely osteoporotic. IMPRESSION: Diffuse osteoporosis. Narrowing medially and  laterally. No acute fracture. Mortise intact. Posterior and inferior calcaneal spurs present. Electronically Signed   By: Lowella Grip III M.D.   On: 03/19/2015 22:06   Dg Hip Unilat With Pelvis 2-3 Views Right  03/19/2015  CLINICAL DATA:  Fall.  Right hip pain.  Initial encounter. EXAM: DG HIP (WITH OR WITHOUT PELVIS) 2-3V RIGHT COMPARISON:  None. FINDINGS: There is no evidence of hip fracture or dislocation. Generalized osteopenia noted. Mild to moderate right hip osteoarthritis demonstrated. IMPRESSION: No acute findings. Mild to moderate hip osteoarthritis and osteopenia. Electronically Signed   By: Earle Gell M.D.   On: 03/19/2015 22:05   I have personally reviewed and evaluated these images and lab results as part of my medical decision-making.   EKG Interpretation None      MDM   Final diagnoses:  Contusion of right hip and thigh, initial encounter    82 y.o. female presents with pain over her right hip and thigh after having a  fall 2-3 days ago. She does have some bruising over her right lateral hip. No acute fracture is noted on plain films. Patient having difficulty with a relation at home. Does not meet criteria for inpatient admission currently without acute pathology identified. I recommended the patient start on scheduled Tylenol therapy for pain as she has not taken anything up to this point and is a potential fall risk with narcotic agents.   Face-to-face consult was placed in epic to have home and needs assessed by physical and occupational therapy as well as home health aid/nursing.   Patient is going home with her family who is available for supervision around-the-clock while awaiting response to therapy. I discussed return precautions for persistent pain or prolonged inability to ambulate at her baseline and discussed possibility of occult fracture which would require CT scan or repeat plain film for identification if not responding to supportive care measures.  I personally performed the services described in this documentation, which was scribed in my presence. The recorded information has been reviewed and is accurate.        Leo Grosser, MD 03/20/15 [phone]

## 2015-03-20 NOTE — Discharge Instructions (Signed)

## 2015-03-25 ENCOUNTER — Encounter: Payer: Self-pay | Admitting: Internal Medicine

## 2015-03-25 ENCOUNTER — Ambulatory Visit (INDEPENDENT_AMBULATORY_CARE_PROVIDER_SITE_OTHER): Payer: Medicare Other | Admitting: Internal Medicine

## 2015-03-25 VITALS — BP 100/70 | HR 63 | Temp 97.8°F | Resp 20

## 2015-03-25 DIAGNOSIS — G6289 Other specified polyneuropathies: Secondary | ICD-10-CM | POA: Diagnosis not present

## 2015-03-25 DIAGNOSIS — E039 Hypothyroidism, unspecified: Secondary | ICD-10-CM | POA: Diagnosis not present

## 2015-03-25 DIAGNOSIS — R3981 Functional urinary incontinence: Secondary | ICD-10-CM

## 2015-03-25 DIAGNOSIS — S7001XA Contusion of right hip, initial encounter: Secondary | ICD-10-CM | POA: Diagnosis not present

## 2015-03-25 DIAGNOSIS — F0150 Vascular dementia without behavioral disturbance: Secondary | ICD-10-CM

## 2015-03-25 DIAGNOSIS — R1310 Dysphagia, unspecified: Secondary | ICD-10-CM

## 2015-03-25 DIAGNOSIS — H409 Unspecified glaucoma: Secondary | ICD-10-CM | POA: Diagnosis not present

## 2015-03-25 DIAGNOSIS — I10 Essential (primary) hypertension: Secondary | ICD-10-CM

## 2015-03-25 DIAGNOSIS — M21372 Foot drop, left foot: Secondary | ICD-10-CM

## 2015-03-25 DIAGNOSIS — M810 Age-related osteoporosis without current pathological fracture: Secondary | ICD-10-CM

## 2015-03-25 DIAGNOSIS — M21371 Foot drop, right foot: Secondary | ICD-10-CM | POA: Diagnosis not present

## 2015-03-25 DIAGNOSIS — B354 Tinea corporis: Secondary | ICD-10-CM | POA: Diagnosis not present

## 2015-03-25 MED ORDER — ACETAMINOPHEN 500 MG PO TABS: 1000.0000 mg | ORAL_TABLET | Freq: Four times a day (QID) | ORAL | Status: DC | PRN

## 2015-03-25 MED ORDER — ALOE VESTA 2-N-1 PROTECTIVE EX OINT: TOPICAL_OINTMENT | Freq: Two times a day (BID) | CUTANEOUS | Status: DC | PRN

## 2015-03-25 MED ORDER — CLOTRIMAZOLE-BETAMETHASONE 1-0.05 % EX CREA: 1.0000 "application " | TOPICAL_CREAM | Freq: Two times a day (BID) | CUTANEOUS | Status: DC

## 2015-03-25 NOTE — Progress Notes (Signed)
Patient ID: Jill Gutierrez, female   DOB: 08-Jul-1917, 82 y.o.   MRN: [id number]   Location: [facility] Provider: Rexene Edison. Mariea Clonts, D.O., C.M.D.  Code Status: DNR Goals of Care: Advanced Directive information Does patient have an advance directive?: No, Would patient like information on creating an advanced directive?: Yes - Educational materials given  Chief Complaint  Patient presents with  . Follow-up    Follow-up from ER visit for a fall and hip pain  . OTHER    daughters in room with patients    HPI: Patient is a 82 y.o. female seen in the office today for med mgt of chronic diseases.    No longer ambulatory.  Can't put weight on her right leg.  Fell when legs gave out when got up to use restroom using her walker.  Second fall.  Day before fell out of bed.  Her daughter was laying on the floor calling her daughter.  Bruise noted that evening and pt unable to hold herself up.  Is too weak even to hold herself up on the leg she didn't bruise.  Weak arms also.  No longer can remember to do things.  Less able to ask for what she needs.  Has not been eating well either the past few days.  Not remembering when she's eaten or not eaten.  Has a step into the shower.  Is getting one shake of ensure per day.    Not having diarrhea anymore, more constipation b/c she does not drink water.  Drinks juice.    Does not qualify for medicaid b/c she owns a home.    Aide and attendance form completed.  Review of Systems:  Review of Systems  Constitutional: Positive for weight loss and malaise/fatigue. Negative for fever and chills.  HENT: Negative for congestion.   Eyes: Negative for blurred vision.  Respiratory: Negative for shortness of breath.   Cardiovascular: Negative for chest pain.  Gastrointestinal: Positive for heartburn.       Dysphagia  Genitourinary: Negative for dysuria.       Incontinent of bowel and bladder now due to immobility  Musculoskeletal: Positive for joint pain and  falls.       Right hip  Skin: Negative for rash.       Bruise right lateral hip  Neurological: Positive for tingling, sensory change and weakness.       Chronic in bilateral feet  Psychiatric/Behavioral: Positive for memory loss.    Past Medical History  Diagnosis Date  . Hypertension   . Thyroid disease   . Dementia   . Screening for lipoid disorders   . Alzheimer's disease   . Loss of weight   . Malignant neoplasm of thyroid gland ([place])   . Benign neoplasm of colon   . Unspecified glaucoma   . Unspecified cataract   . Unspecified essential hypertension   . Hemorrhoids   . Reflux esophagitis   . Unspecified constipation   . Osteoporosis, unspecified   . Memory loss   . Urge incontinence   . Routine general medical examination at a health care facility   . Malignant neoplasm of thyroid gland ([place])   . Unspecified glaucoma   . Unspecified hypothyroidism 06/03/2012    Past Surgical History  Procedure Laterality Date  . Thyroidectomy    . No past surgeries    . Hemorrhoidectomy    . Eye surgery      No Known Allergies    Medication List  This list is accurate as of: 03/25/15 10:19 AM.  Always use your most recent med list.               diclofenac sodium 1 % Gel  Commonly known as:  VOLTAREN  Apply to hands as needed.     gabapentin 100 MG capsule  Commonly known as:  NEURONTIN  Take 1 capsule (100 mg total) by mouth 3 (three) times daily.     hydrochlorothiazide 25 MG tablet  Commonly known as:  HYDRODIURIL  TAKE ONE TABLET BY MOUTH ONCE DAILY     latanoprost 0.005 % ophthalmic solution  Commonly known as:  XALATAN  Place 1 drop into both eyes daily. Place 1 drop into both eyes daily for glaucoma.     levothyroxine 75 MCG tablet  Commonly known as:  SYNTHROID, LEVOTHROID  Take one tablet by mouth once daily 30 minutes before breakfast on empty stomach for thyroid.     Memantine HCl-Donepezil HCl 28-10 MG Cp24  Commonly known as:  NAMZARIC    Take 1 capsule by mouth daily. Opened and sprinkled in food     Tdap 5-2.5-18.5 LF-MCG/0.5 injection  Commonly known as:  BOOSTRIX  Inject 0.5 mLs into the muscle once.     zoster vaccine live (PF) 19400 UNT/0.65ML injection  Commonly known as:  ZOSTAVAX  Inject 19,400 Units into the skin once.        Health Maintenance  Topic Date Due  . TETANUS/TDAP  10/07/1938  . ZOSTAVAX  10/07/1979  . PNA vac Low Risk Adult (1 of 2 - PCV13) 10/06/1984  . INFLUENZA VACCINE  12/24/2016 (Originally 10/05/2014)  . DEXA SCAN  Completed    Physical Exam: Filed Vitals:   03/25/15 0939  BP: 100/70  Pulse: 63  Temp: 97.8 F (36.6 C)  TempSrc: Oral  Resp: 20  SpO2: 95%   There is no weight on file to calculate BMI. Physical Exam  Constitutional: No distress.  HENT:  Head: Normocephalic and atraumatic.  Cardiovascular: Normal rate, regular rhythm and normal heart sounds.   Pulmonary/Chest: Effort normal and breath sounds normal. No respiratory distress.  Musculoskeletal: She exhibits tenderness.  Right hip and increased pain with flexion, but no shortening or rotation  Neurological: She is alert.  Skin:  Large right hip ecchymoses present, tender to touch  Psychiatric:  Flat affect    Labs reviewed: Basic Metabolic Panel:  Recent Labs  04/23/14 1611 10/29/14 1130  NA 144 143  K 3.6 3.5  CL 101 100  CO2 27 27  GLUCOSE 90 111*  BUN 18 12  CREATININE 1.23* 1.36*  CALCIUM 9.3 9.2  TSH 0.056* 9.530*   Liver Function Tests:  Recent Labs  04/23/14 1611  AST 18  ALT 7  ALKPHOS 88  BILITOT 0.3  PROT 6.7  ALBUMIN 4.1   No results for input(s): LIPASE, AMYLASE in the last 8760 hours. No results for input(s): AMMONIA in the last 8760 hours. CBC:  Recent Labs  04/23/14 1611  WBC 4.2  NEUTROABS 2.6  HGB 12.9  HCT 39.1  MCV 87  PLT 155  only urinalysis and culture which were negative done at ED, no bloodwork.  Procedures since last visit: Left ankle xrays  03/19/15:  Diffuse osteoporosis. Narrowing medially and laterally. No acute fracture. Mortise intact. Posterior and inferior calcaneal spurs present.  Right hip and pelvis 03/19/15:  No hip fracture or dislocation.  Generalized osteopenia.  Mild to moderate right hip OA.  Assessment/Plan 1.  Vascular dementia without behavioral disturbance - doing more poorly since her fall 1/13 with hip contusion - no longer ambulatory with walker, cannot even stand to transfer - has three daughters who share looking after her, but they need more assistance - Ambulatory referral to [facility] - CBC with Differential/Platelet and CMP done as no bloodwork was done at ED--potential causes of delirium besides UTI need to be ruled out -if decline persists, would consider hospice care; but she might do better short-term with home health assistance in the home  2. Contusion of right hip, initial encounter - cont tylenol use and voltaren gel - acetaminophen (TYLENOL) 500 MG tablet; Take 2 tablets (1,000 mg total) by mouth every 6 (six) hours as needed for moderate pain.  Dispense: 60 tablet; Refill: 0 -only painful when she is moved/transferred -discussed that she will need to be repositioned every 2 hours and a barrier cream used after cleansing post-incontinence episodes to prevent skin breakdown/pressure ulcers -obtain chucks for the bed -home health services for more education and to help obtain supplies  3. Tinea corporis - of arm and abdomen--cont lotrisone for these - clotrimazole-betamethasone (LOTRISONE) cream; Apply 1 application topically 2 (two) times daily. To right arm dry spot until resolved.  Dispense: 15 g; Refill: 1  4. Urinary incontinence due to cognitive impairment - again needs barrier cream use, family notes no current skin breakdown so must prevent - petrolatum-hydrophilic-aloe vera (ALOE VESTA) ointment; Apply topically 2 (two) times daily as needed for wound care.  Dispense: 226 g; Refill:  0 - CBC with Differential/Platelet - UA and culture negative  5. Other polyneuropathy ([place]) -longstanding, cont gabapentin for this pain in her feet which is longstanding and I suspect comes from a spinal stenosis/back  6. Essential hypertension, benign -bp well controlled, but she appears quite dry and [facility] could not get blood--had to go to [facility] site today - CBC with Differential/Platelet - CMP; Future  7. Glaucoma -cont drops  8. Senile osteoporosis -surprising she did not have a fracture with her known OP -was supposed to be on vitamin D, but it's not on her list again -not really a benefit at this point when she's not ambulatory   9. Hypothyroidism, adult -cont current synthroid 30 mins before breakfast  10. Dysphagia -has been problematic over time -aspiration precautions--probably should have hospital bed so [facility] can be elevated  11. Bilateral foot-drop -longstanding, suspect related to back   Labs/tests ordered:   Orders Placed This Encounter  Procedures  . CBC with Differential/Platelet  . CMP    Standing Status: Future     Number of Occurrences:      Standing Expiration Date: 03/24/2016    Order Specific Question:  Has the patient fasted?    Answer:  Yes  . Ambulatory referral to Home Health    Referral Priority:  Routine    Referral Type:  Home Health Care    Referral Reason:  Specialty Services Required    Requested Specialty:  [place]    Number of Visits Requested:  1    Next appt:  1 month to f/u hip pain, but appears appt was not made when she left here  Jennifr Gaeta L. Milanna Kozlov, D.O. [facility] [address], [place] [address] Cell Phone (Mon-Fri 8am-5pm):  [phone] On Call:  [phone] & follow prompts after 5pm & weekends Office Phone:  [phone] Office Fax:  [phone]

## 2015-03-25 NOTE — Patient Instructions (Addendum)
Increase ensure to 2 per day.    Get chucks for bed  Use barrier cream after bathing to prevent pressure ulcers and move her every 2 hours  I ordered home health PT, OT, ST, nursing and nurse's aide

## 2015-03-26 ENCOUNTER — Telehealth: Payer: Self-pay | Admitting: *Deleted

## 2015-03-26 LAB — BASIC METABOLIC PANEL
BUN: 16 mg/dL (ref 4–21)
Creatinine: 1.1 mg/dL (ref 0.5–1.1)
GLUCOSE: 97 mg/dL
Potassium: 3.9 mmol/L (ref 3.4–5.3)
Sodium: 141 mmol/L (ref 137–147)

## 2015-03-26 LAB — CBC AND DIFFERENTIAL
HEMATOCRIT: 37 % (ref 36–46)
Hemoglobin: 12.7 g/dL (ref 12.0–16.0)
Neutrophils Absolute: 3 /uL
Platelets: 153 10*3/uL (ref 150–399)
WBC: 4.6 10*3/mL

## 2015-03-26 LAB — HEPATIC FUNCTION PANEL
ALT: 13 U/L (ref 7–35)
AST: 23 U/L (ref 13–35)
Alkaline Phosphatase: 89 U/L (ref 25–125)
Bilirubin, Total: 0.5 mg/dL

## 2015-03-26 NOTE — Telephone Encounter (Signed)
Received labs, CBC,CMP from [facility]. Per Dr. Reed--Labs were ok. No changes to medications. Notify daughter. [name], daughter notified and agreed.

## 2015-04-01 DIAGNOSIS — S7001XD Contusion of right hip, subsequent encounter: Secondary | ICD-10-CM | POA: Diagnosis not present

## 2015-04-01 DIAGNOSIS — G6289 Other specified polyneuropathies: Secondary | ICD-10-CM | POA: Diagnosis not present

## 2015-04-01 DIAGNOSIS — M21371 Foot drop, right foot: Secondary | ICD-10-CM | POA: Diagnosis not present

## 2015-04-01 DIAGNOSIS — F0150 Vascular dementia without behavioral disturbance: Secondary | ICD-10-CM | POA: Diagnosis not present

## 2015-04-01 DIAGNOSIS — M1611 Unilateral primary osteoarthritis, right hip: Secondary | ICD-10-CM | POA: Diagnosis not present

## 2015-04-01 DIAGNOSIS — I69398 Other sequelae of cerebral infarction: Secondary | ICD-10-CM | POA: Diagnosis not present

## 2015-04-02 ENCOUNTER — Encounter: Payer: Self-pay | Admitting: *Deleted

## 2015-04-08 ENCOUNTER — Encounter: Payer: Self-pay | Admitting: Internal Medicine

## 2015-04-08 ENCOUNTER — Telehealth: Payer: Self-pay

## 2015-04-08 NOTE — Telephone Encounter (Signed)
[place] nurse called stating that they need a clarification on etiology of vascular dementia   Please advise

## 2015-04-08 NOTE — Telephone Encounter (Signed)
Multiple small strokes over time caused vascular dementia.

## 2015-04-08 NOTE — Telephone Encounter (Signed)
Left message on voicemail with Dr.Reed's response

## 2015-04-13 ENCOUNTER — Encounter: Payer: Self-pay | Admitting: *Deleted

## 2015-04-13 ENCOUNTER — Telehealth: Payer: Self-pay | Admitting: *Deleted

## 2015-04-13 MED ORDER — AMBULATORY NON FORMULARY MEDICATION: Status: DC

## 2015-04-13 NOTE — Telephone Encounter (Signed)
Rx's printed and given to Dr. Mariea Clonts to review and sign. Daughter notified to pick up tomorrow.

## 2015-04-13 NOTE — Telephone Encounter (Signed)
Jill Gutierrez, daughter called and stated that patient needs written Rx's for 1. Raised Toilet Seat with Handles 2. Hospital Bed 3. [place] Lift Please Advise

## 2015-04-13 NOTE — Telephone Encounter (Signed)
LMOM for Jill Gutierrez to return call.

## 2015-04-13 NOTE — Telephone Encounter (Signed)
Aren't they currently getting home health services?  Typically these are arranged through them.  We can certainly write the prescriptions--diagnosis:  Cerebrovascular disease, hip contusion, vascular dementia, protein calorie malnutrition.

## 2015-04-22 ENCOUNTER — Telehealth: Payer: Self-pay | Admitting: *Deleted

## 2015-04-22 ENCOUNTER — Other Ambulatory Visit: Payer: Self-pay | Admitting: Internal Medicine

## 2015-04-22 NOTE — Telephone Encounter (Signed)
Daughter, Jill Gutierrez called and stated that patient is receiving PT, OT and nursing and it is getting to be too much because too D[place] Bahl people at home at one time. And it is becoming an inconvenience because somebody has to be there. Daughter wants to send patient to a Rehab facility instead and wants your thoughts and opinion. Please Advise.

## 2015-04-22 NOTE — Telephone Encounter (Signed)
I agree with skilled nursing placement for her long term.  Have they considered which facility they would prefer?

## 2015-04-23 NOTE — Telephone Encounter (Signed)
LMOM for daughter, [name] regarding Dr. Cyndi Lennert response.

## 2015-05-10 ENCOUNTER — Ambulatory Visit (INDEPENDENT_AMBULATORY_CARE_PROVIDER_SITE_OTHER): Payer: Medicare Other | Admitting: Internal Medicine

## 2015-05-10 ENCOUNTER — Encounter: Payer: Self-pay | Admitting: Internal Medicine

## 2015-05-10 VITALS — BP 124/68 | HR 57 | Temp 97.5°F | Resp 14 | Ht 60.0 in | Wt 148.0 lb

## 2015-05-10 DIAGNOSIS — H409 Unspecified glaucoma: Secondary | ICD-10-CM | POA: Diagnosis not present

## 2015-05-10 DIAGNOSIS — E785 Hyperlipidemia, unspecified: Secondary | ICD-10-CM | POA: Diagnosis not present

## 2015-05-10 DIAGNOSIS — G6289 Other specified polyneuropathies: Secondary | ICD-10-CM | POA: Diagnosis not present

## 2015-05-10 DIAGNOSIS — M1990 Unspecified osteoarthritis, unspecified site: Secondary | ICD-10-CM

## 2015-05-10 DIAGNOSIS — F0150 Vascular dementia without behavioral disturbance: Secondary | ICD-10-CM | POA: Diagnosis not present

## 2015-05-10 DIAGNOSIS — R3981 Functional urinary incontinence: Secondary | ICD-10-CM | POA: Diagnosis not present

## 2015-05-10 DIAGNOSIS — M810 Age-related osteoporosis without current pathological fracture: Secondary | ICD-10-CM | POA: Diagnosis not present

## 2015-05-10 DIAGNOSIS — Z111 Encounter for screening for respiratory tuberculosis: Secondary | ICD-10-CM | POA: Diagnosis not present

## 2015-05-10 DIAGNOSIS — I10 Essential (primary) hypertension: Secondary | ICD-10-CM | POA: Diagnosis not present

## 2015-05-10 MED ORDER — GABAPENTIN 100 MG PO CAPS: 100.0000 mg | ORAL_CAPSULE | Freq: Three times a day (TID) | ORAL | Status: DC

## 2015-05-10 MED ORDER — DICLOFENAC SODIUM 1 % TD GEL: TRANSDERMAL | Status: DC

## 2015-05-10 MED ORDER — LATANOPROST 0.005 % OP SOLN: 1.0000 [drp] | Freq: Every day | OPHTHALMIC | Status: DC

## 2015-05-10 NOTE — Progress Notes (Signed)
Patient ID: Jill Gutierrez, female   DOB: 07-25-1917, 82 y.o.   MRN: [id number]   Location:  [facility] Provider:  Edy Mcbane L. Mariea Clonts, D.O., C.M.D.  Code Status: DNR Goals of Care:  Advanced Directives 03/25/2015  Does patient have an advance directive? No  Would patient like information on creating an advanced directive? Yes - Geneticist, molecular Complaint  Patient presents with  . Form Completion    Complete form for [facility] and TB skin test     HPI: Patient is a 82 y.o. female seen today for completion of her FL-2 for possible admission to [facility] [place].  She has moderate to severe dementia.  She remains verbal, but her ambulation has diminished significantly over the past month due to a hip contusion.  Her daughters report she is walking with her walker some again after home health PT.  I have gotten multiple reports from home health of missed appts for therapy and nursing, however.  They report that her buttocks is wound-free.  She has been dependent on her family for 5 years for her medication mgt due to her memory loss.  She still refuses meds at times.  Her hypothyroidism has been difficult to control due to this and a long period where her children were allowing her to take her pills on her own from the pillbox against my advice.  They say her memory is pretty good for her age.    She's had increased urinary incontinence, but they report she has no fecal incontinence (unclear how this was possible when she was bedbound).    Her vision is poor due to glaucoma and macular degeneration.    She has bilateral foot drop presumably from prior lumbar spinal stenosis, but came to me wearing braces on both feet.  She also has neuropathic pain in her legs.    They report she is eating well.  She has been losing weight.  Supplements have been encouraged.  Past Medical History  Diagnosis Date  . Hypertension   . Thyroid disease   . Dementia   .  Screening for lipoid disorders   . Alzheimer's disease   . Loss of weight   . Malignant neoplasm of thyroid gland ([place])   . Benign neoplasm of colon   . Unspecified glaucoma   . Unspecified cataract   . Unspecified essential hypertension   . Hemorrhoids   . Reflux esophagitis   . Unspecified constipation   . Osteoporosis, unspecified   . Memory loss   . Urge incontinence   . Routine general medical examination at a health care facility   . Malignant neoplasm of thyroid gland ([place])   . Unspecified glaucoma   . Unspecified hypothyroidism 06/03/2012  . Cerebrovascular disease   . Protein calorie malnutrition [facility])     Past Surgical History  Procedure Laterality Date  . Thyroidectomy    . No past surgeries    . Hemorrhoidectomy    . Eye surgery      No Known Allergies    Medication List       This list is accurate as of: 05/10/15 11:24 AM.  Always use your most recent med list.               acetaminophen 500 MG tablet  Commonly known as:  TYLENOL  Take 2 tablets (1,000 mg total) by mouth every 6 (six) hours as needed for moderate pain.  diclofenac sodium 1 % Gel  Commonly known as:  VOLTAREN  Apply to hands as needed.     gabapentin 100 MG capsule  Commonly known as:  NEURONTIN  Take 100 mg by mouth 2 (two) times daily.     Glucosamine-Chondroitin 1500-1200 MG/30ML Liqd  Take 1 oz by mouth daily.     hydrochlorothiazide 25 MG tablet  Commonly known as:  HYDRODIURIL  TAKE ONE TABLET BY MOUTH ONCE DAILY     latanoprost 0.005 % ophthalmic solution  Commonly known as:  XALATAN  Place 1 drop into both eyes daily. Place 1 drop into both eyes daily for glaucoma.     levothyroxine 50 MCG tablet  Commonly known as:  SYNTHROID, LEVOTHROID  TAKE ONE TABLET BY MOUTH 30 MINUTES BEFORE BREAKFAST ON AN EMPTY STOMACH     [place] calcium 500 MG Tabs tablet  Take 500 mg of elemental calcium by mouth 2 (two) times daily.     SUPER B-50 COMPLEX PO  Take by mouth  daily.     Vitamin D (Ergocalciferol) 50000 units Caps capsule  Commonly known as:  DRISDOL  Take 50,000 Units by mouth every 7 (seven) days.        Review of Systems:  Review of Systems  Constitutional: Positive for weight loss and malaise/fatigue. Negative for fever and chills.  HENT: Negative for hearing loss.   Eyes: Positive for blurred vision and pain.  Respiratory: Negative for cough and shortness of breath.   Cardiovascular: Negative for chest pain, palpitations and leg swelling.  Gastrointestinal: Positive for constipation. Negative for abdominal pain, blood in stool and melena.  Genitourinary: Positive for urgency. Negative for dysuria.  Musculoskeletal: Positive for back pain and joint pain. Negative for falls.       Last fall was when hip contusion occured  Skin: Negative for rash.  Neurological: Positive for tingling, sensory change and weakness. Negative for dizziness and loss of consciousness.  Psychiatric/Behavioral: Positive for memory loss. Negative for depression.       Denies depression but has always seemed down to me    Health Maintenance  Topic Date Due  . TETANUS/TDAP  10/07/1938  . ZOSTAVAX  10/07/1979  . PNA vac Low Risk Adult (1 of 2 - PCV13) 10/06/1984  . INFLUENZA VACCINE  12/24/2016 (Originally 10/05/2014)  . DEXA SCAN  Completed    Physical Exam: Filed Vitals:   05/10/15 1051  BP: 124/68  Pulse: 57  Temp: 97.5 F (36.4 C)  TempSrc: Oral  Resp: 14  SpO2: 97%   There is no weight on file to calculate BMI. Physical Exam  Constitutional: No distress.  sarcopenic black female seated in wheelchair  HENT:  Head: Normocephalic and atraumatic.  Neck: Neck supple.  Cardiovascular: Normal rate, regular rhythm, normal heart sounds and intact distal pulses.   Pulmonary/Chest: Effort normal and breath sounds normal. No respiratory distress.  Abdominal: Soft. Bowel sounds are normal. She exhibits no distension. There is no tenderness.    Musculoskeletal:  Has bilateral foot drop; no longer has tenderness of right hip; came in wheelchair, but is semi-ambulatory with walker  Neurological: She is alert. She exhibits abnormal muscle tone.  Oriented to person only  Skin: Skin is warm and dry.  Psychiatric:  Flat affect; makes sarcastic remarks frequently    Labs reviewed: Basic Metabolic Panel:  Recent Labs  10/29/14 1130 03/26/15  NA 143 141  K 3.5 3.9  CL 100  --   CO2 27  --  GLUCOSE 111*  --   BUN 12 16  CREATININE 1.36* 1.1  CALCIUM 9.2  --   TSH 9.530*  --    Liver Function Tests:  Recent Labs  03/26/15  AST 23  ALT 13  ALKPHOS 89   No results for input(s): LIPASE, AMYLASE in the last 8760 hours. No results for input(s): AMMONIA in the last 8760 hours. CBC:  Recent Labs  03/26/15  WBC 4.6  NEUTROABS 3  HGB 12.7  HCT 37  PLT 153   Lipid Panel: No results for input(s): CHOL, HDL, LDLCALC, TRIG, CHOLHDL, LDLDIRECT in the last 8760 hours. No results found for: HGBA1C  Assessment/Plan 1. Glaucoma - continues on her eye drops; vision is poor - latanoprost (XALATAN) 0.005 % ophthalmic solution; Place 1 drop into both eyes daily. Place 1 drop into both eyes daily for glaucoma.  Dispense: 2.5 mL; Refill: 11  2. Urinary incontinence due to cognitive impairment -ongoing, not on meds  -likely all from dementia so meds won't help anyway and most will worsen cognition   3. Senile osteoporosis -cont weekly vitamin D supplement and daily calcium  4. Essential hypertension, benign -bp at goal with hctz only--would consider changing this when she's in a controlled environment--might help with incontinence -at home, it probably wouldn't have changed despite orders  5. Screening-pulmonary TB -for ALF placement - PPD placed today and to return in 48-72 hrs for reading before admission to [facility]  6.  Vascular dementia without behavior -is off all meds for this -had diarrhea with aricept and  namenda was too expensive--now so advanced, it's not beneficial  7.  Polyneuropathy -mix of neuropathy and lumbar spinal stenosis with foot drop causing pain -has had some benefit to gabapentin -consider increasing dose if renal function allows  8.  Hyperlipidemia: -not on meds due to advanced age and lack of benefit; initially was due to pt and caregiver nonadherence  9.  Osteoarthritis -her biggest complaint -has voltaren gel for pain and tylenol which she probably isn't ever getting now b/c it's prn and no one is with her to give it and she is not alert enough to take it--should be changing soon  Labs/tests ordered:  Orders Placed This Encounter  Procedures  . PPD    Order Specific Question:  Has patient ever tested positive?    Answer:  No    Next appt:  08/09/2015 med mgt    Damarrion Mimbs L. Glori Machnik, D.O. [facility] [address], [place] [address] Cell Phone (Mon-Fri 8am-5pm):  [phone] On Call:  [phone] & follow prompts after 5pm & weekends Office Phone:  [phone] Office Fax:  [phone]

## 2015-08-09 ENCOUNTER — Ambulatory Visit: Payer: Medicare Other | Admitting: Internal Medicine

## 2015-08-09 ENCOUNTER — Encounter: Payer: Self-pay | Admitting: *Deleted

## 2016-02-01 DIAGNOSIS — Z23 Encounter for immunization: Secondary | ICD-10-CM | POA: Diagnosis not present

## 2016-10-02 DIAGNOSIS — M79675 Pain in left toe(s): Secondary | ICD-10-CM | POA: Diagnosis not present

## 2016-11-13 ENCOUNTER — Emergency Department (HOSPITAL_COMMUNITY): Payer: Medicare Other

## 2016-11-13 ENCOUNTER — Emergency Department (HOSPITAL_COMMUNITY)
Admission: EM | Admit: 2016-11-13 | Discharge: 2016-11-13 | Disposition: A | Discharge status: Home / Self Care | Payer: Medicare Other | Attending: Emergency Medicine | Admitting: Emergency Medicine

## 2016-11-13 ENCOUNTER — Encounter (HOSPITAL_COMMUNITY): Payer: Self-pay | Admitting: Emergency Medicine

## 2016-11-13 DIAGNOSIS — E039 Hypothyroidism, unspecified: Secondary | ICD-10-CM | POA: Diagnosis not present

## 2016-11-13 DIAGNOSIS — S12001A Unspecified nondisplaced fracture of first cervical vertebra, initial encounter for closed fracture: Secondary | ICD-10-CM | POA: Diagnosis not present

## 2016-11-13 DIAGNOSIS — S0083XA Contusion of other part of head, initial encounter: Secondary | ICD-10-CM | POA: Insufficient documentation

## 2016-11-13 DIAGNOSIS — Z85850 Personal history of malignant neoplasm of thyroid: Secondary | ICD-10-CM | POA: Diagnosis not present

## 2016-11-13 DIAGNOSIS — Z23 Encounter for immunization: Secondary | ICD-10-CM | POA: Diagnosis not present

## 2016-11-13 DIAGNOSIS — Y92128 Other place in nursing home as the place of occurrence of the external cause: Secondary | ICD-10-CM | POA: Diagnosis not present

## 2016-11-13 DIAGNOSIS — Y998 Other external cause status: Secondary | ICD-10-CM | POA: Insufficient documentation

## 2016-11-13 DIAGNOSIS — Y939 Activity, unspecified: Secondary | ICD-10-CM | POA: Diagnosis not present

## 2016-11-13 DIAGNOSIS — R220 Localized swelling, mass and lump, head: Secondary | ICD-10-CM | POA: Insufficient documentation

## 2016-11-13 DIAGNOSIS — G309 Alzheimer's disease, unspecified: Secondary | ICD-10-CM | POA: Insufficient documentation

## 2016-11-13 DIAGNOSIS — S0181XA Laceration without foreign body of other part of head, initial encounter: Secondary | ICD-10-CM | POA: Diagnosis not present

## 2016-11-13 DIAGNOSIS — W050XXA Fall from non-moving wheelchair, initial encounter: Secondary | ICD-10-CM | POA: Insufficient documentation

## 2016-11-13 DIAGNOSIS — I10 Essential (primary) hypertension: Secondary | ICD-10-CM | POA: Diagnosis not present

## 2016-11-13 DIAGNOSIS — Z79899 Other long term (current) drug therapy: Secondary | ICD-10-CM | POA: Diagnosis not present

## 2016-11-13 DIAGNOSIS — S098XXA Other specified injuries of head, initial encounter: Secondary | ICD-10-CM | POA: Diagnosis not present

## 2016-11-13 MED ORDER — BACITRACIN ZINC 500 UNIT/GM EX OINT
1.0000 | TOPICAL_OINTMENT | Freq: Two times a day (BID) | CUTANEOUS | Status: DC
Start: 2016-11-13 — End: 2016-11-13
  Administered 2016-11-13: 1 via TOPICAL
  Filled 2016-11-13: qty 0.9

## 2016-11-13 MED ORDER — TETANUS-DIPHTH-ACELL PERTUSSIS 5-2.5-18.5 LF-MCG/0.5 IM SUSP
0.5000 mL | Freq: Once | INTRAMUSCULAR | Status: AC
  Administered 2016-11-13: 0.5 mL via INTRAMUSCULAR
  Filled 2016-11-13: qty 0.5

## 2016-11-13 NOTE — ED Triage Notes (Signed)
Per [facility]: pt from [facility] presenting with a large hematoma to right side of head due to an unwitnessed fall from her W/C in the facility's activity room.  Pt is alert and  confused and also nonverbal which is pt's baseline according to facility's staff. Pt is able to follow commands. PTA vitals: 192/103, HR 80, Sp02 98% RA, CBG: 106.

## 2016-11-13 NOTE — ED Notes (Signed)
Patient transported to CT 

## 2016-11-13 NOTE — ED Notes (Signed)
Placed external cath on pt  

## 2016-11-13 NOTE — ED Notes (Signed)
Got patient hooked up to there monitor patient is resting with call bell in reach

## 2016-11-13 NOTE — ED Notes (Signed)
Pt unable to sign discharge

## 2016-11-13 NOTE — Discharge Instructions (Signed)
1. Jill Gutierrez BROKE A BONE AT THE TOP OF HER CERVICAL SPINE (NECK). THE NEUROSURGEON, DR. POOL, HAS RECOMMENDED A SOFT NECK COLLAR AND TO FOLLOW UP IN THE CLINIC IN A FEW WEEKS.  2. RETURN TO ER IF SHE HAS ANY SUDDEN WEAKNESS OR CHANGE IN HER BASELINE MENTAL STATUS.

## 2016-11-13 NOTE — ED Provider Notes (Signed)
[facility] DEPT Provider Note   CSN: [id number] Arrival date & time: 11/13/16  1052     History   Chief Complaint Chief Complaint  Patient presents with  . Fall    HPI Jill Gutierrez is a 83 y.o. female.  83 year old female with extensive past medical history below including dementia, nonverbal at baseline who presents with fall. Just prior to arrival, nursing facility reported that the patient had an unwitnessed fall out of her wheelchair in the activity room. She was found to have a large hematoma on her forehead and they called EMS. She has been comfortable during transport.  LEVEL 5 CAVEAT DUE TO DEMENTIA   The history is provided by the nursing home and the EMS personnel.  Fall     Past Medical History:  Diagnosis Date  . Alzheimer's disease   . Benign neoplasm of colon   . Cerebrovascular disease   . Dementia   . Hemorrhoids   . Hypertension   . Loss of weight   . Malignant neoplasm of thyroid gland ([place])   . Malignant neoplasm of thyroid gland ([place])   . Memory loss   . Osteoporosis, unspecified   . Protein calorie malnutrition ([place])   . Reflux esophagitis   . Routine general medical examination at a health care facility   . Screening for lipoid disorders   . Thyroid disease   . Unspecified cataract   . Unspecified constipation   . Unspecified essential hypertension   . Unspecified glaucoma(365.9)   . Unspecified glaucoma(365.9)   . Unspecified hypothyroidism 06/03/2012  . Urge incontinence     Patient Active Problem List   Diagnosis Date Noted  . Cutaneous fungal infection 10/29/2014  . Hemorrhoid 11/20/2013  . Dysphagia 11/20/2013  . Urinary incontinence due to cognitive impairment 06/03/2012  . Senile osteoporosis 06/03/2012  . Vascular dementia 06/03/2012  . Hypothyroidism 06/03/2012  . Hyperlipidemia LDL goal < 100 06/03/2012  . Dementia 09/25/2011  . Dyslipidemia 09/25/2011    Past Surgical History:  Procedure Laterality Date  . EYE  SURGERY    . hemorrhoidectomy    . NO PAST SURGERIES    . THYROIDECTOMY      OB History    No data available       Home Medications    Prior to Admission medications   Medication Sig Start Date End Date Taking? Authorizing Provider  acetaminophen (TYLENOL) 500 MG tablet Take 2 tablets (1,000 mg total) by mouth every 6 (six) hours as needed for moderate pain. Patient taking differently: Take 500 mg by mouth every 4 (four) hours as needed for moderate pain.  03/25/15  Yes Reed, Tiffany L, DO  alum & mag hydroxide-simeth (MINTOX) 200-200-20 MG/5ML suspension Take 30 mLs by mouth as needed for indigestion or heartburn.   Yes [provider]  diclofenac sodium (VOLTAREN) 1 % GEL Apply to hands as needed. 05/10/15  Yes Reed, Tiffany L, DO  Glucosamine-Chondroitin 1500-1200 MG/30ML LIQD Take 30 mLs by mouth daily.    Yes [provider]  guaifenesin (ROBITUSSIN) 100 MG/5ML syrup Take 200 mg by mouth every 6 (six) hours as needed for cough.   Yes [provider]  latanoprost (XALATAN) 0.005 % ophthalmic solution Place 1 drop into both eyes daily. Place 1 drop into both eyes daily for glaucoma. 05/10/15  Yes Reed, Tiffany L, DO  levothyroxine (SYNTHROID, LEVOTHROID) 200 MCG tablet Take 200 mcg by mouth daily before breakfast.   Yes [provider]  levothyroxine (SYNTHROID,  LEVOTHROID) 50 MCG tablet TAKE ONE TABLET BY MOUTH 30 MINUTES BEFORE BREAKFAST ON AN EMPTY STOMACH 04/22/15  Yes Reed, Tiffany L, DO  loperamide (IMODIUM) 2 MG capsule Take by mouth as needed for diarrhea or loose stools.   Yes [provider]  magnesium hydroxide (MILK OF MAGNESIA) 400 MG/5ML suspension Take 30 mLs by mouth at bedtime as needed for mild constipation.   Yes [provider]  metoprolol succinate (TOPROL-XL) 25 MG 24 hr tablet Take 25 mg by mouth daily.   Yes [provider]  Misc. Devices MISC 1 each by Does not apply route daily.   Yes [provider]  Multiple Vitamin (MULTIVITAMIN WITH MINERALS) TABS tablet Take 1 tablet by mouth daily.   Yes [provider]  neomycin-bacitracin-polymyxin (NEOSPORIN) OINT Apply 1 application topically as needed for wound care (for skin tears or abrasions).   Yes [provider]  [place] (OYSTER CALCIUM) 500 MG TABS tablet Take 500 mg of elemental calcium by mouth 2 (two) times daily.   Yes [provider]  polyethylene glycol (MIRALAX / GLYCOLAX) packet Take 17 g by mouth daily.   Yes [provider]  B Complex-Biotin-FA (SUPER B-50 COMPLEX PO) Take by mouth daily.    [provider]  gabapentin (NEURONTIN) 100 MG capsule Take 1 capsule (100 mg total) by mouth 3 (three) times daily. 05/10/15   Reed, Tiffany L, DO  hydrochlorothiazide (HYDRODIURIL) 25 MG tablet TAKE ONE TABLET BY MOUTH ONCE DAILY 04/22/15   Reed, Tiffany L, DO  Vitamin D, Ergocalciferol, (DRISDOL) 50000 units CAPS capsule Take 50,000 Units by mouth every 7 (seven) days.    [provider]    Family History Family History  Problem Relation Age of Onset  . Diabetes Father     Social History Social History  Substance Use Topics  . Smoking status: Never Smoker  . Smokeless tobacco: Not on file  . Alcohol use No     Allergies   Patient has no known allergies.   Review of Systems Review of Systems  Unable to perform ROS: Dementia     Physical Exam Updated Vital Signs BP (!) 184/85   Pulse (!) 56   Temp (!) 97.4 F (36.3 C) (Oral)   Resp 14   SpO2 100%   Physical Exam  Constitutional: No distress.  Frail, elderly woman awake and comfortable  HENT:  Head: Normocephalic.  Large hematoma of right forehead extending to the side of scalp with overlying abrasion  Eyes: Pupils are equal, round, and reactive to light. Conjunctivae are normal.  Neck:  In c-collar  Cardiovascular: Normal rate, regular rhythm and normal heart sounds.   No murmur  heard. Pulmonary/Chest: Effort normal and breath sounds normal.  Abdominal: Soft. Bowel sounds are normal. She exhibits no distension. There is no tenderness.  Musculoskeletal: She exhibits no edema, tenderness or deformity.  Diffuse muscle atrophy of all extremities  Neurological: She is alert.  Nonverbal  Skin: Skin is warm and dry.  Nursing note and vitals reviewed.    ED Treatments / Results  Labs (all labs ordered are listed, but only abnormal results are displayed) Labs Reviewed - No data to display  EKG  EKG Interpretation  Date/Time:  Monday November 13 2016 11:01:55 EDT Ventricular Rate:  70 PR Interval:    QRS Duration: 124 QT Interval:  532 QTC Calculation: 575 R Axis:   11 Text Interpretation:  Sinus rhythm Multiple ventricular premature complexes Left bundle branch  block Baseline wander in lead(s) V3 Interpretation limited secondary to artifact No previous ECGs available Confirmed by Theotis Burrow [phone]) on 11/13/2016 11:13:03 AM       Radiology Ct Head Wo Contrast  Result Date: 11/13/2016 CLINICAL DATA:  Fall.  Right-sided soft tissue swelling. EXAM: CT HEAD WITHOUT CONTRAST CT CERVICAL SPINE WITHOUT CONTRAST TECHNIQUE: Multidetector CT imaging of the head and cervical spine was performed following the standard protocol without intravenous contrast. Multiplanar CT image reconstructions of the cervical spine were also generated. COMPARISON:  None. FINDINGS: CT HEAD FINDINGS Brain: There is atrophy and chronic small vessel disease changes. Calcifications in the basal ganglia bilaterally. No acute intracranial abnormality. Specifically, no hemorrhage, hydrocephalus, mass lesion, acute infarction, or significant intracranial injury. Mild ex vacuo dilatation of the ventricles. Vascular: No hyperdense vessel or unexpected calcification. Skull: No acute calvarial abnormality. Sinuses/Orbits: Visualized paranasal sinuses and mastoids clear. Orbital soft tissues  unremarkable. Other: Soft tissue swelling in the right frontal region. CT CERVICAL SPINE FINDINGS Alignment: Normal Skull base and vertebrae: There is a fracture through the left C1 body. No significant displacement. No additional fracture. Soft tissues and spinal canal: Prevertebral soft tissues are normal. No epidural or paraspinal hematoma. Disc levels: Diffuse degenerative disc disease with large anterior and left lateral osteophytes C4-C7. Diffuse bilateral degenerative facet disease. Upper chest: Negative Other: None IMPRESSION: No acute intracranial abnormality.Atrophy, chronic microvascular disease. Fracture through the left C1 vertebral body without significant displacement. Critical Value/emergent results were called by telephone at the time of interpretation on 11/13/2016 at 1:39 pm to Dr. Theotis Burrow , who verbally acknowledged these results. Electronically Signed   By: Rolm Baptise M.D.   On: 11/13/2016 13:41   Ct Cervical Spine Wo Contrast  Result Date: 11/13/2016 CLINICAL DATA:  Fall.  Right-sided soft tissue swelling. EXAM: CT HEAD WITHOUT CONTRAST CT CERVICAL SPINE WITHOUT CONTRAST TECHNIQUE: Multidetector CT imaging of the head and cervical spine was performed following the standard protocol without intravenous contrast. Multiplanar CT image reconstructions of the cervical spine were also generated. COMPARISON:  None. FINDINGS: CT HEAD FINDINGS Brain: There is atrophy and chronic small vessel disease changes. Calcifications in the basal ganglia bilaterally. No acute intracranial abnormality. Specifically, no hemorrhage, hydrocephalus, mass lesion, acute infarction, or significant intracranial injury. Mild ex vacuo dilatation of the ventricles. Vascular: No hyperdense vessel or unexpected calcification. Skull: No acute calvarial abnormality. Sinuses/Orbits: Visualized paranasal sinuses and mastoids clear. Orbital soft tissues unremarkable. Other: Soft tissue swelling in the right frontal  region. CT CERVICAL SPINE FINDINGS Alignment: Normal Skull base and vertebrae: There is a fracture through the left C1 body. No significant displacement. No additional fracture. Soft tissues and spinal canal: Prevertebral soft tissues are normal. No epidural or paraspinal hematoma. Disc levels: Diffuse degenerative disc disease with large anterior and left lateral osteophytes C4-C7. Diffuse bilateral degenerative facet disease. Upper chest: Negative Other: None IMPRESSION: No acute intracranial abnormality.Atrophy, chronic microvascular disease. Fracture through the left C1 vertebral body without significant displacement. Critical Value/emergent results were called by telephone at the time of interpretation on 11/13/2016 at 1:39 pm to Dr. Theotis Burrow , who verbally acknowledged these results. Electronically Signed   By: Rolm Baptise M.D.   On: 11/13/2016 13:41    Procedures Procedures (including critical care time)  Medications Ordered in ED Medications  bacitracin ointment 1 application (1 application Topical Given 11/13/16 1250)  Tdap (BOOSTRIX) injection 0.5 mL (0.5 mLs Intramuscular Given 11/13/16 1249)     Initial Impression / Assessment and Plan /  ED Course  I have reviewed the triage vital signs and the nursing notes.  Pertinent  imaging results that were available during my care of the patient were reviewed by me and considered in my medical decision making (see chart for details).     Pt w/ advanced dementia brought in by EMS after an unwitnessed fall from her wheelchair. She was at her neurologic baseline which is nonverbal according to nursing facility. She'll large hematoma on her right scalp with overlying abrasion. Obtained CT of head and C-spine. Applied bacitracin to wound. I reviewed her chart had recent office visit with PCP earlier this which shows tetanus is not up-to-date, therefore ordered Tdap.   CT shows no intracranial injury but does note a left C1 vertebral body  fracture without displacement. The patient has been moving all 4 extremities spontaneously while in the ED. I discussed her injury with neurosurgery, Dr. Annette Stable. He reviewed images and stated that fracture appeared stable and she could be discharged home with soft collar and no need for any acute surgical intervention. He recommended follow-up in a few weeks in his clinic. I have discussed this plan with the patient's family and also provided with details of plan in her discharge paperwork for her nursing facility. Patient has otherwise been comfortable here with no other apparent injuries. Return precautions reviewed. Patient discharged in satisfactory condition. Final Clinical Impressions(s) / ED Diagnoses   Final diagnoses:  Closed nondisplaced fracture of first cervical vertebra, unspecified fracture morphology, initial encounter [facility])    New Prescriptions New Prescriptions   No medications on file     Little, Wenda Overland, MD 11/13/16 1717

## 2016-11-30 DIAGNOSIS — Z961 Presence of intraocular lens: Secondary | ICD-10-CM | POA: Diagnosis not present

## 2016-11-30 DIAGNOSIS — H409 Unspecified glaucoma: Secondary | ICD-10-CM | POA: Diagnosis not present

## 2017-01-29 DIAGNOSIS — I10 Essential (primary) hypertension: Secondary | ICD-10-CM | POA: Diagnosis not present

## 2017-01-29 DIAGNOSIS — H409 Unspecified glaucoma: Secondary | ICD-10-CM | POA: Diagnosis not present

## 2017-01-29 DIAGNOSIS — E039 Hypothyroidism, unspecified: Secondary | ICD-10-CM | POA: Diagnosis not present

## 2017-01-29 DIAGNOSIS — I672 Cerebral atherosclerosis: Secondary | ICD-10-CM | POA: Diagnosis not present

## 2017-01-29 DIAGNOSIS — R1310 Dysphagia, unspecified: Secondary | ICD-10-CM | POA: Diagnosis not present

## 2017-01-29 DIAGNOSIS — M2540 Effusion, unspecified joint: Secondary | ICD-10-CM | POA: Diagnosis not present

## 2017-01-29 DIAGNOSIS — N183 Chronic kidney disease, stage 3 (moderate): Secondary | ICD-10-CM | POA: Diagnosis not present

## 2017-01-29 DIAGNOSIS — R54 Age-related physical debility: Secondary | ICD-10-CM | POA: Diagnosis not present

## 2017-01-29 DIAGNOSIS — F0150 Vascular dementia without behavioral disturbance: Secondary | ICD-10-CM | POA: Diagnosis not present

## 2017-01-30 DIAGNOSIS — M2540 Effusion, unspecified joint: Secondary | ICD-10-CM | POA: Diagnosis not present

## 2017-01-30 DIAGNOSIS — F0150 Vascular dementia without behavioral disturbance: Secondary | ICD-10-CM | POA: Diagnosis not present

## 2017-01-30 DIAGNOSIS — N183 Chronic kidney disease, stage 3 (moderate): Secondary | ICD-10-CM | POA: Diagnosis not present

## 2017-01-30 DIAGNOSIS — R1310 Dysphagia, unspecified: Secondary | ICD-10-CM | POA: Diagnosis not present

## 2017-01-30 DIAGNOSIS — I10 Essential (primary) hypertension: Secondary | ICD-10-CM | POA: Diagnosis not present

## 2017-01-30 DIAGNOSIS — I672 Cerebral atherosclerosis: Secondary | ICD-10-CM | POA: Diagnosis not present

## 2017-01-31 DIAGNOSIS — F0150 Vascular dementia without behavioral disturbance: Secondary | ICD-10-CM | POA: Diagnosis not present

## 2017-01-31 DIAGNOSIS — I10 Essential (primary) hypertension: Secondary | ICD-10-CM | POA: Diagnosis not present

## 2017-01-31 DIAGNOSIS — R1310 Dysphagia, unspecified: Secondary | ICD-10-CM | POA: Diagnosis not present

## 2017-01-31 DIAGNOSIS — M2540 Effusion, unspecified joint: Secondary | ICD-10-CM | POA: Diagnosis not present

## 2017-01-31 DIAGNOSIS — I672 Cerebral atherosclerosis: Secondary | ICD-10-CM | POA: Diagnosis not present

## 2017-01-31 DIAGNOSIS — N183 Chronic kidney disease, stage 3 (moderate): Secondary | ICD-10-CM | POA: Diagnosis not present

## 2017-02-01 DIAGNOSIS — F0150 Vascular dementia without behavioral disturbance: Secondary | ICD-10-CM | POA: Diagnosis not present

## 2017-02-01 DIAGNOSIS — M2540 Effusion, unspecified joint: Secondary | ICD-10-CM | POA: Diagnosis not present

## 2017-02-01 DIAGNOSIS — R1310 Dysphagia, unspecified: Secondary | ICD-10-CM | POA: Diagnosis not present

## 2017-02-01 DIAGNOSIS — I10 Essential (primary) hypertension: Secondary | ICD-10-CM | POA: Diagnosis not present

## 2017-02-01 DIAGNOSIS — I672 Cerebral atherosclerosis: Secondary | ICD-10-CM | POA: Diagnosis not present

## 2017-02-01 DIAGNOSIS — N183 Chronic kidney disease, stage 3 (moderate): Secondary | ICD-10-CM | POA: Diagnosis not present

## 2017-02-02 DIAGNOSIS — N183 Chronic kidney disease, stage 3 (moderate): Secondary | ICD-10-CM | POA: Diagnosis not present

## 2017-02-02 DIAGNOSIS — I672 Cerebral atherosclerosis: Secondary | ICD-10-CM | POA: Diagnosis not present

## 2017-02-02 DIAGNOSIS — I10 Essential (primary) hypertension: Secondary | ICD-10-CM | POA: Diagnosis not present

## 2017-02-02 DIAGNOSIS — F0150 Vascular dementia without behavioral disturbance: Secondary | ICD-10-CM | POA: Diagnosis not present

## 2017-02-02 DIAGNOSIS — R1310 Dysphagia, unspecified: Secondary | ICD-10-CM | POA: Diagnosis not present

## 2017-02-02 DIAGNOSIS — M2540 Effusion, unspecified joint: Secondary | ICD-10-CM | POA: Diagnosis not present

## 2017-02-03 DIAGNOSIS — N183 Chronic kidney disease, stage 3 (moderate): Secondary | ICD-10-CM | POA: Diagnosis not present

## 2017-02-03 DIAGNOSIS — M2540 Effusion, unspecified joint: Secondary | ICD-10-CM | POA: Diagnosis not present

## 2017-02-03 DIAGNOSIS — R1310 Dysphagia, unspecified: Secondary | ICD-10-CM | POA: Diagnosis not present

## 2017-02-03 DIAGNOSIS — H409 Unspecified glaucoma: Secondary | ICD-10-CM | POA: Diagnosis not present

## 2017-02-03 DIAGNOSIS — E039 Hypothyroidism, unspecified: Secondary | ICD-10-CM | POA: Diagnosis not present

## 2017-02-03 DIAGNOSIS — R54 Age-related physical debility: Secondary | ICD-10-CM | POA: Diagnosis not present

## 2017-02-03 DIAGNOSIS — I672 Cerebral atherosclerosis: Secondary | ICD-10-CM | POA: Diagnosis not present

## 2017-02-03 DIAGNOSIS — I10 Essential (primary) hypertension: Secondary | ICD-10-CM | POA: Diagnosis not present

## 2017-02-03 DIAGNOSIS — F0150 Vascular dementia without behavioral disturbance: Secondary | ICD-10-CM | POA: Diagnosis not present

## 2017-02-05 DIAGNOSIS — R627 Adult failure to thrive: Secondary | ICD-10-CM | POA: Diagnosis not present

## 2017-02-05 DIAGNOSIS — M2540 Effusion, unspecified joint: Secondary | ICD-10-CM | POA: Diagnosis not present

## 2017-02-05 DIAGNOSIS — F0150 Vascular dementia without behavioral disturbance: Secondary | ICD-10-CM | POA: Diagnosis not present

## 2017-02-05 DIAGNOSIS — N183 Chronic kidney disease, stage 3 (moderate): Secondary | ICD-10-CM | POA: Diagnosis not present

## 2017-02-05 DIAGNOSIS — I10 Essential (primary) hypertension: Secondary | ICD-10-CM | POA: Diagnosis not present

## 2017-02-05 DIAGNOSIS — R1310 Dysphagia, unspecified: Secondary | ICD-10-CM | POA: Diagnosis not present

## 2017-02-05 DIAGNOSIS — I672 Cerebral atherosclerosis: Secondary | ICD-10-CM | POA: Diagnosis not present

## 2017-02-05 DIAGNOSIS — R296 Repeated falls: Secondary | ICD-10-CM | POA: Diagnosis not present

## 2017-02-05 DIAGNOSIS — M6281 Muscle weakness (generalized): Secondary | ICD-10-CM | POA: Diagnosis not present

## 2017-02-06 DIAGNOSIS — F0150 Vascular dementia without behavioral disturbance: Secondary | ICD-10-CM | POA: Diagnosis not present

## 2017-02-06 DIAGNOSIS — I672 Cerebral atherosclerosis: Secondary | ICD-10-CM | POA: Diagnosis not present

## 2017-02-06 DIAGNOSIS — R1310 Dysphagia, unspecified: Secondary | ICD-10-CM | POA: Diagnosis not present

## 2017-02-06 DIAGNOSIS — N183 Chronic kidney disease, stage 3 (moderate): Secondary | ICD-10-CM | POA: Diagnosis not present

## 2017-02-06 DIAGNOSIS — I10 Essential (primary) hypertension: Secondary | ICD-10-CM | POA: Diagnosis not present

## 2017-02-06 DIAGNOSIS — M2540 Effusion, unspecified joint: Secondary | ICD-10-CM | POA: Diagnosis not present

## 2017-02-08 DIAGNOSIS — M2540 Effusion, unspecified joint: Secondary | ICD-10-CM | POA: Diagnosis not present

## 2017-02-08 DIAGNOSIS — R1310 Dysphagia, unspecified: Secondary | ICD-10-CM | POA: Diagnosis not present

## 2017-02-08 DIAGNOSIS — I10 Essential (primary) hypertension: Secondary | ICD-10-CM | POA: Diagnosis not present

## 2017-02-08 DIAGNOSIS — I672 Cerebral atherosclerosis: Secondary | ICD-10-CM | POA: Diagnosis not present

## 2017-02-08 DIAGNOSIS — F0150 Vascular dementia without behavioral disturbance: Secondary | ICD-10-CM | POA: Diagnosis not present

## 2017-02-08 DIAGNOSIS — N183 Chronic kidney disease, stage 3 (moderate): Secondary | ICD-10-CM | POA: Diagnosis not present

## 2017-02-09 DIAGNOSIS — I10 Essential (primary) hypertension: Secondary | ICD-10-CM | POA: Diagnosis not present

## 2017-02-09 DIAGNOSIS — M2540 Effusion, unspecified joint: Secondary | ICD-10-CM | POA: Diagnosis not present

## 2017-02-09 DIAGNOSIS — N183 Chronic kidney disease, stage 3 (moderate): Secondary | ICD-10-CM | POA: Diagnosis not present

## 2017-02-09 DIAGNOSIS — I672 Cerebral atherosclerosis: Secondary | ICD-10-CM | POA: Diagnosis not present

## 2017-02-09 DIAGNOSIS — R1310 Dysphagia, unspecified: Secondary | ICD-10-CM | POA: Diagnosis not present

## 2017-02-09 DIAGNOSIS — F0150 Vascular dementia without behavioral disturbance: Secondary | ICD-10-CM | POA: Diagnosis not present

## 2017-02-15 DIAGNOSIS — R1310 Dysphagia, unspecified: Secondary | ICD-10-CM | POA: Diagnosis not present

## 2017-02-15 DIAGNOSIS — I672 Cerebral atherosclerosis: Secondary | ICD-10-CM | POA: Diagnosis not present

## 2017-02-15 DIAGNOSIS — M2540 Effusion, unspecified joint: Secondary | ICD-10-CM | POA: Diagnosis not present

## 2017-02-15 DIAGNOSIS — N183 Chronic kidney disease, stage 3 (moderate): Secondary | ICD-10-CM | POA: Diagnosis not present

## 2017-02-15 DIAGNOSIS — I10 Essential (primary) hypertension: Secondary | ICD-10-CM | POA: Diagnosis not present

## 2017-02-15 DIAGNOSIS — F0150 Vascular dementia without behavioral disturbance: Secondary | ICD-10-CM | POA: Diagnosis not present

## 2017-02-16 DIAGNOSIS — I10 Essential (primary) hypertension: Secondary | ICD-10-CM | POA: Diagnosis not present

## 2017-02-16 DIAGNOSIS — I672 Cerebral atherosclerosis: Secondary | ICD-10-CM | POA: Diagnosis not present

## 2017-02-16 DIAGNOSIS — R1310 Dysphagia, unspecified: Secondary | ICD-10-CM | POA: Diagnosis not present

## 2017-02-16 DIAGNOSIS — N183 Chronic kidney disease, stage 3 (moderate): Secondary | ICD-10-CM | POA: Diagnosis not present

## 2017-02-16 DIAGNOSIS — M2540 Effusion, unspecified joint: Secondary | ICD-10-CM | POA: Diagnosis not present

## 2017-02-16 DIAGNOSIS — F0150 Vascular dementia without behavioral disturbance: Secondary | ICD-10-CM | POA: Diagnosis not present

## 2017-02-19 DIAGNOSIS — R627 Adult failure to thrive: Secondary | ICD-10-CM | POA: Diagnosis not present

## 2017-02-19 DIAGNOSIS — M6281 Muscle weakness (generalized): Secondary | ICD-10-CM | POA: Diagnosis not present

## 2017-02-19 DIAGNOSIS — F0150 Vascular dementia without behavioral disturbance: Secondary | ICD-10-CM | POA: Diagnosis not present

## 2017-02-19 DIAGNOSIS — R634 Abnormal weight loss: Secondary | ICD-10-CM | POA: Diagnosis not present

## 2017-02-20 ENCOUNTER — Ambulatory Visit (INDEPENDENT_AMBULATORY_CARE_PROVIDER_SITE_OTHER): Payer: Medicare Other | Admitting: Nurse Practitioner

## 2017-02-20 ENCOUNTER — Encounter: Payer: Self-pay | Admitting: Nurse Practitioner

## 2017-02-20 VITALS — BP 130/80 | Temp 97.4°F | Resp 20 | Ht 60.0 in

## 2017-02-20 DIAGNOSIS — M810 Age-related osteoporosis without current pathological fracture: Secondary | ICD-10-CM

## 2017-02-20 DIAGNOSIS — M2540 Effusion, unspecified joint: Secondary | ICD-10-CM | POA: Diagnosis not present

## 2017-02-20 DIAGNOSIS — H409 Unspecified glaucoma: Secondary | ICD-10-CM

## 2017-02-20 DIAGNOSIS — I10 Essential (primary) hypertension: Secondary | ICD-10-CM

## 2017-02-20 DIAGNOSIS — E039 Hypothyroidism, unspecified: Secondary | ICD-10-CM

## 2017-02-20 DIAGNOSIS — N183 Chronic kidney disease, stage 3 (moderate): Secondary | ICD-10-CM | POA: Diagnosis not present

## 2017-02-20 DIAGNOSIS — I672 Cerebral atherosclerosis: Secondary | ICD-10-CM | POA: Diagnosis not present

## 2017-02-20 DIAGNOSIS — R1310 Dysphagia, unspecified: Secondary | ICD-10-CM | POA: Diagnosis not present

## 2017-02-20 DIAGNOSIS — F0150 Vascular dementia without behavioral disturbance: Secondary | ICD-10-CM | POA: Diagnosis not present

## 2017-02-20 DIAGNOSIS — M1990 Unspecified osteoarthritis, unspecified site: Secondary | ICD-10-CM

## 2017-02-20 MED ORDER — TRAMADOL HCL 50 MG PO TABS: 50.0000 mg | ORAL_TABLET | Freq: Three times a day (TID) | ORAL | 0 refills | Status: DC | PRN

## 2017-02-20 NOTE — Patient Instructions (Addendum)
STOP Imodium - notify for diarrhea  Decrease SCHEDULE ultram to 25 mg by mouth twice daily for 7 days then decrease to 25 mg daily for 7 days and then DC Can use Ultram (tramadol) 50 mg by mouth every 8 hours as needed pain.

## 2017-02-20 NOTE — Progress Notes (Signed)
Careteam: Patient Care Team: System, Pcp Not In as PCP - General  Advanced Directive information    No Known Allergies  Chief Complaint  Patient presents with  . Acute Visit    Per family request medication reduction and would like to have extremities checked for circulation     HPI: Patient is a 83 y.o. female seen in the office today to re-establish care. She is here with daughter who provides hx.   Pt is currently at [facility] memory care under hospice services.  She was seeing the provider at [facility] but daughter was not happy with the services.  Daughter will like to reduce medication list- feels like she is on too much and lethargic.  Patient with advanced dementia.  Says she never complains of pain  Osteoarthritis- pain in hands and feet, use Voltaren gel, using glucosamine- chondroitin, using ultram 50 mg by mouth twice daily- daughter think this is making her lethargic Pt also had a fall with closed nondisplaced fx of cervical spine in September- no signs of pain at this time.   Hypothyroid- unsure when her last lab work was at facility. Daughter feels like this is a lot higher dose than she was previously on and questions this.  Taking 250 mcg daily.   No diarrhea or constipation that daughter is aware of-- using miralax daily   Hx of bedsores but this has healed.   HTN- taking toprol XL   Review of Systems:  Review of Systems  Unable to perform ROS: Dementia    Past Medical History:  Diagnosis Date  . Alzheimer's disease   . Benign neoplasm of colon   . Cerebrovascular disease   . Dementia   . Hemorrhoids   . Hypertension   . Loss of weight   . Malignant neoplasm of thyroid gland ([place])   . Malignant neoplasm of thyroid gland ([place])   . Memory loss   . Osteoporosis, unspecified   . Protein calorie malnutrition ([place])   . Reflux esophagitis   . Routine general medical examination at a health care facility   . Screening for lipoid  disorders   . Thyroid disease   . Unspecified cataract   . Unspecified constipation   . Unspecified essential hypertension   . Unspecified glaucoma(365.9)   . Unspecified glaucoma(365.9)   . Unspecified hypothyroidism 06/03/2012  . Urge incontinence    Past Surgical History:  Procedure Laterality Date  . EYE SURGERY    . hemorrhoidectomy    . NO PAST SURGERIES    . THYROIDECTOMY     Social History:   reports that  has never smoked. she has never used smokeless tobacco. She reports that she does not drink alcohol or use drugs.  Family History  Problem Relation Age of Onset  . Diabetes Father     Medications:   Medication List        Accurate as of 02/20/17  3:27 PM. Always use your most recent med list.          acetaminophen 500 MG tablet Commonly known as:  TYLENOL   diclofenac sodium 1 % Gel Commonly known as:  VOLTAREN   Glucosamine-Chondroitin 1500-1200 MG/30ML Liqd   latanoprost 0.005 % ophthalmic solution Commonly known as:  XALATAN Place 1 drop into both eyes daily. Place 1 drop into both eyes daily for glaucoma.   * levothyroxine 200 MCG tablet Commonly known as:  SYNTHROID, LEVOTHROID   * levothyroxine 50 MCG tablet Commonly known  as:  SYNTHROID, LEVOTHROID TAKE ONE TABLET BY MOUTH 30 MINUTES BEFORE BREAKFAST ON AN EMPTY STOMACH   loperamide 2 MG capsule Commonly known as:  IMODIUM   magnesium hydroxide 400 MG/5ML suspension Commonly known as:  MILK OF MAGNESIA   metoprolol succinate 25 MG 24 hr tablet Commonly known as:  TOPROL-XL   MINTOX 200-200-20 MG/5ML suspension Generic drug:  alum & mag hydroxide-simeth   multivitamin with minerals Tabs tablet   neomycin-bacitracin-polymyxin Oint Commonly known as:  NEOSPORIN   [place] calcium 500 MG Tabs tablet   polyethylene glycol packet Commonly known as:  MIRALAX / GLYCOLAX   traMADol 50 MG tablet Commonly known as:  ULTRAM      * This list has 2 medication(s) that are the same as  other medications prescribed for you. Read the directions carefully, and ask your doctor or other care provider to review them with you.           Physical Exam:  Vitals:   02/20/17 1451  BP: 130/80  Resp: 20  Temp: (!) 97.4 F (36.3 C)  TempSrc: Oral  Height: 5' (1.524 m)   Body mass index is 28.9 kg/m.  Physical Exam  Constitutional: No distress.  Frail elderly female in [facility], nonverbal  HENT:  Head: Normocephalic and atraumatic.  Eyes: Conjunctivae are normal. Pupils are equal, round, and reactive to light.  Neck: Normal range of motion.  Cardiovascular: Normal rate, regular rhythm and normal heart sounds.  Pulmonary/Chest: Effort normal and breath sounds normal.  Abdominal: Soft. Bowel sounds are normal.  Musculoskeletal: She exhibits no edema or tenderness.  Neurological: She is alert.  Skin: Skin is warm and dry. Capillary refill takes 2 to 3 seconds. She is not diaphoretic.  Psychiatric: She has a normal mood and affect. Cognition and memory are impaired. She exhibits abnormal recent memory.   Labs reviewed: Basic Metabolic Panel: No results for input(s): NA, K, CL, CO2, GLUCOSE, BUN, CREATININE, CALCIUM, MG, PHOS, TSH in the last 8760 hours. Liver Function Tests: No results for input(s): AST, ALT, ALKPHOS, BILITOT, PROT, ALBUMIN in the last 8760 hours. No results for input(s): LIPASE, AMYLASE in the last 8760 hours. No results for input(s): AMMONIA in the last 8760 hours. CBC: No results for input(s): WBC, NEUTROABS, HGB, HCT, MCV, PLT in the last 8760 hours. Lipid Panel: No results for input(s): CHOL, HDL, LDLCALC, TRIG, CHOLHDL, LDLDIRECT in the last 8760 hours. TSH: No results for input(s): TSH in the last 8760 hours. A1C: No results found for: HGBA1C   Assessment/Plan 1. Vascular dementia without behavioral disturbance Advanced dementia currently in memory care at [facility] under hospice care.   2. Glaucoma of both eyes, unspecified glaucoma  type -cont on xalatan  3. Essential hypertension, benign -blood pressure stable, continue on toprol XL   4. Senile osteoporosis -nonambulatory, currently on calcium supplement   5. Arthritis -no signs of pain. Daughter would like to stop tramadol. Will titrate off and monitor for s/s of pain and have tramadol use for PRN. May need to schedule tylenol for pain.  - traMADol (ULTRAM) 50 MG tablet; Take 1 tablet (50 mg total) by mouth every 8 (eight) hours as needed.  Dispense: 30 tablet; Refill: 0  6. Hypothyroidism, adult -daughter feels like she is on too high of synthroid dose, will follow up TSH today - TSH  Next appt: 3 months Elchanan Bob K. Harle Battiest  [facility] [phone] 8 am - 5 pm) [phone] (after hours)

## 2017-02-21 ENCOUNTER — Other Ambulatory Visit: Payer: Self-pay | Admitting: Nurse Practitioner

## 2017-02-21 ENCOUNTER — Other Ambulatory Visit: Payer: Self-pay

## 2017-02-21 DIAGNOSIS — I10 Essential (primary) hypertension: Secondary | ICD-10-CM | POA: Diagnosis not present

## 2017-02-21 DIAGNOSIS — N183 Chronic kidney disease, stage 3 (moderate): Secondary | ICD-10-CM | POA: Diagnosis not present

## 2017-02-21 DIAGNOSIS — R1310 Dysphagia, unspecified: Secondary | ICD-10-CM | POA: Diagnosis not present

## 2017-02-21 DIAGNOSIS — E039 Hypothyroidism, unspecified: Secondary | ICD-10-CM

## 2017-02-21 DIAGNOSIS — F0150 Vascular dementia without behavioral disturbance: Secondary | ICD-10-CM | POA: Diagnosis not present

## 2017-02-21 DIAGNOSIS — M2540 Effusion, unspecified joint: Secondary | ICD-10-CM | POA: Diagnosis not present

## 2017-02-21 DIAGNOSIS — I672 Cerebral atherosclerosis: Secondary | ICD-10-CM | POA: Diagnosis not present

## 2017-02-21 LAB — TSH: TSH: 0.05 m[IU]/L — AB (ref 0.40–4.50)

## 2017-02-21 MED ORDER — LEVOTHYROXINE SODIUM 200 MCG PO TABS: 200.0000 ug | ORAL_TABLET | Freq: Every day | ORAL | 1 refills | Status: DC

## 2017-02-21 NOTE — Telephone Encounter (Signed)
Medication list has been updated based on recommendations for lab results. Rx has been printed for pick up by patient's daughter to take to [facility].

## 2017-02-22 ENCOUNTER — Telehealth: Payer: Self-pay | Admitting: *Deleted

## 2017-02-22 DIAGNOSIS — I10 Essential (primary) hypertension: Secondary | ICD-10-CM | POA: Diagnosis not present

## 2017-02-22 DIAGNOSIS — R1310 Dysphagia, unspecified: Secondary | ICD-10-CM | POA: Diagnosis not present

## 2017-02-22 DIAGNOSIS — I672 Cerebral atherosclerosis: Secondary | ICD-10-CM | POA: Diagnosis not present

## 2017-02-22 DIAGNOSIS — N183 Chronic kidney disease, stage 3 (moderate): Secondary | ICD-10-CM | POA: Diagnosis not present

## 2017-02-22 DIAGNOSIS — M2540 Effusion, unspecified joint: Secondary | ICD-10-CM | POA: Diagnosis not present

## 2017-02-22 DIAGNOSIS — F0150 Vascular dementia without behavioral disturbance: Secondary | ICD-10-CM | POA: Diagnosis not present

## 2017-02-22 NOTE — Telephone Encounter (Signed)
[name] with [facility] called and stated that Janett Billow wrote an order for patient for Tramadol 50mg  take 25mg  twice daily for 7 days, then 25mg  once daily for 7 days then stop and start 50mg . [name] stated that they cannot cut the pills in half and needs a Rx for the 25mg  tablets. Please Advise.

## 2017-02-23 ENCOUNTER — Telehealth: Payer: Self-pay

## 2017-02-23 DIAGNOSIS — F0150 Vascular dementia without behavioral disturbance: Secondary | ICD-10-CM | POA: Diagnosis not present

## 2017-02-23 DIAGNOSIS — R1310 Dysphagia, unspecified: Secondary | ICD-10-CM | POA: Diagnosis not present

## 2017-02-23 DIAGNOSIS — I672 Cerebral atherosclerosis: Secondary | ICD-10-CM | POA: Diagnosis not present

## 2017-02-23 DIAGNOSIS — I10 Essential (primary) hypertension: Secondary | ICD-10-CM | POA: Diagnosis not present

## 2017-02-23 DIAGNOSIS — N183 Chronic kidney disease, stage 3 (moderate): Secondary | ICD-10-CM | POA: Diagnosis not present

## 2017-02-23 DIAGNOSIS — M2540 Effusion, unspecified joint: Secondary | ICD-10-CM | POA: Diagnosis not present

## 2017-02-23 MED ORDER — TRAMADOL HCL 50 MG PO TABS: ORAL_TABLET | ORAL | 0 refills | Status: DC

## 2017-02-23 NOTE — Telephone Encounter (Signed)
Patient's daughter came by the office to say that [place] could not accept the decrease schedule of ultram as it was written on the AVS from patient's visit.   I explained that Janett Billow has discontinued that order and facility should be using the following prn order:  Tramadol 50 mg- take 1 tablet by mouth every 8 hours as needed for pain.   Daughter verbalized understanding. I spoke with Crystal (nurse on memory care floor) who also verbalized understanding.

## 2017-02-23 NOTE — Telephone Encounter (Signed)
An Rx was given to pts daughter.

## 2017-02-23 NOTE — Telephone Encounter (Signed)
Jill Gutierrez

## 2017-02-23 NOTE — Telephone Encounter (Signed)
Nursing can also not give halved medication, therefore lets STOP ultram at this time and have staff give PRN order

## 2017-02-23 NOTE — Telephone Encounter (Signed)
Jill Gutierrez stated that the Rx was written for 50mg  not 25mg . Nurse stated that they are NOT able to cut anything in half. She stated that daughter gave her 2 rxs and the 25 mg was not one of them. Please Advise.

## 2017-02-23 NOTE — Telephone Encounter (Signed)
Order has been printed and signed

## 2017-02-26 DIAGNOSIS — N183 Chronic kidney disease, stage 3 (moderate): Secondary | ICD-10-CM | POA: Diagnosis not present

## 2017-02-26 DIAGNOSIS — R1310 Dysphagia, unspecified: Secondary | ICD-10-CM | POA: Diagnosis not present

## 2017-02-26 DIAGNOSIS — I10 Essential (primary) hypertension: Secondary | ICD-10-CM | POA: Diagnosis not present

## 2017-02-26 DIAGNOSIS — F0150 Vascular dementia without behavioral disturbance: Secondary | ICD-10-CM | POA: Diagnosis not present

## 2017-02-26 DIAGNOSIS — I672 Cerebral atherosclerosis: Secondary | ICD-10-CM | POA: Diagnosis not present

## 2017-02-26 DIAGNOSIS — M2540 Effusion, unspecified joint: Secondary | ICD-10-CM | POA: Diagnosis not present

## 2017-03-01 DIAGNOSIS — N183 Chronic kidney disease, stage 3 (moderate): Secondary | ICD-10-CM | POA: Diagnosis not present

## 2017-03-01 DIAGNOSIS — I10 Essential (primary) hypertension: Secondary | ICD-10-CM | POA: Diagnosis not present

## 2017-03-01 DIAGNOSIS — I672 Cerebral atherosclerosis: Secondary | ICD-10-CM | POA: Diagnosis not present

## 2017-03-01 DIAGNOSIS — M2540 Effusion, unspecified joint: Secondary | ICD-10-CM | POA: Diagnosis not present

## 2017-03-01 DIAGNOSIS — F0150 Vascular dementia without behavioral disturbance: Secondary | ICD-10-CM | POA: Diagnosis not present

## 2017-03-01 DIAGNOSIS — R1310 Dysphagia, unspecified: Secondary | ICD-10-CM | POA: Diagnosis not present

## 2017-03-06 DIAGNOSIS — I10 Essential (primary) hypertension: Secondary | ICD-10-CM | POA: Diagnosis not present

## 2017-03-06 DIAGNOSIS — H409 Unspecified glaucoma: Secondary | ICD-10-CM | POA: Diagnosis not present

## 2017-03-06 DIAGNOSIS — N183 Chronic kidney disease, stage 3 (moderate): Secondary | ICD-10-CM | POA: Diagnosis not present

## 2017-03-06 DIAGNOSIS — R54 Age-related physical debility: Secondary | ICD-10-CM | POA: Diagnosis not present

## 2017-03-06 DIAGNOSIS — F0150 Vascular dementia without behavioral disturbance: Secondary | ICD-10-CM | POA: Diagnosis not present

## 2017-03-06 DIAGNOSIS — R1310 Dysphagia, unspecified: Secondary | ICD-10-CM | POA: Diagnosis not present

## 2017-03-06 DIAGNOSIS — E039 Hypothyroidism, unspecified: Secondary | ICD-10-CM | POA: Diagnosis not present

## 2017-03-06 DIAGNOSIS — M2540 Effusion, unspecified joint: Secondary | ICD-10-CM | POA: Diagnosis not present

## 2017-03-06 DIAGNOSIS — I672 Cerebral atherosclerosis: Secondary | ICD-10-CM | POA: Diagnosis not present

## 2017-03-08 DIAGNOSIS — R1310 Dysphagia, unspecified: Secondary | ICD-10-CM | POA: Diagnosis not present

## 2017-03-08 DIAGNOSIS — N183 Chronic kidney disease, stage 3 (moderate): Secondary | ICD-10-CM | POA: Diagnosis not present

## 2017-03-08 DIAGNOSIS — F0150 Vascular dementia without behavioral disturbance: Secondary | ICD-10-CM | POA: Diagnosis not present

## 2017-03-08 DIAGNOSIS — I10 Essential (primary) hypertension: Secondary | ICD-10-CM | POA: Diagnosis not present

## 2017-03-08 DIAGNOSIS — M2540 Effusion, unspecified joint: Secondary | ICD-10-CM | POA: Diagnosis not present

## 2017-03-08 DIAGNOSIS — I672 Cerebral atherosclerosis: Secondary | ICD-10-CM | POA: Diagnosis not present

## 2017-03-09 DIAGNOSIS — I10 Essential (primary) hypertension: Secondary | ICD-10-CM | POA: Diagnosis not present

## 2017-03-09 DIAGNOSIS — I672 Cerebral atherosclerosis: Secondary | ICD-10-CM | POA: Diagnosis not present

## 2017-03-09 DIAGNOSIS — F0150 Vascular dementia without behavioral disturbance: Secondary | ICD-10-CM | POA: Diagnosis not present

## 2017-03-09 DIAGNOSIS — M2540 Effusion, unspecified joint: Secondary | ICD-10-CM | POA: Diagnosis not present

## 2017-03-09 DIAGNOSIS — R1310 Dysphagia, unspecified: Secondary | ICD-10-CM | POA: Diagnosis not present

## 2017-03-09 DIAGNOSIS — N183 Chronic kidney disease, stage 3 (moderate): Secondary | ICD-10-CM | POA: Diagnosis not present

## 2017-03-12 DIAGNOSIS — R1310 Dysphagia, unspecified: Secondary | ICD-10-CM | POA: Diagnosis not present

## 2017-03-12 DIAGNOSIS — M2540 Effusion, unspecified joint: Secondary | ICD-10-CM | POA: Diagnosis not present

## 2017-03-12 DIAGNOSIS — N183 Chronic kidney disease, stage 3 (moderate): Secondary | ICD-10-CM | POA: Diagnosis not present

## 2017-03-12 DIAGNOSIS — I672 Cerebral atherosclerosis: Secondary | ICD-10-CM | POA: Diagnosis not present

## 2017-03-12 DIAGNOSIS — I10 Essential (primary) hypertension: Secondary | ICD-10-CM | POA: Diagnosis not present

## 2017-03-12 DIAGNOSIS — F0150 Vascular dementia without behavioral disturbance: Secondary | ICD-10-CM | POA: Diagnosis not present

## 2017-03-13 DIAGNOSIS — I672 Cerebral atherosclerosis: Secondary | ICD-10-CM | POA: Diagnosis not present

## 2017-03-13 DIAGNOSIS — R1310 Dysphagia, unspecified: Secondary | ICD-10-CM | POA: Diagnosis not present

## 2017-03-13 DIAGNOSIS — M2540 Effusion, unspecified joint: Secondary | ICD-10-CM | POA: Diagnosis not present

## 2017-03-13 DIAGNOSIS — I10 Essential (primary) hypertension: Secondary | ICD-10-CM | POA: Diagnosis not present

## 2017-03-13 DIAGNOSIS — F0150 Vascular dementia without behavioral disturbance: Secondary | ICD-10-CM | POA: Diagnosis not present

## 2017-03-13 DIAGNOSIS — N183 Chronic kidney disease, stage 3 (moderate): Secondary | ICD-10-CM | POA: Diagnosis not present

## 2017-03-15 DIAGNOSIS — I10 Essential (primary) hypertension: Secondary | ICD-10-CM | POA: Diagnosis not present

## 2017-03-15 DIAGNOSIS — R1310 Dysphagia, unspecified: Secondary | ICD-10-CM | POA: Diagnosis not present

## 2017-03-15 DIAGNOSIS — M2540 Effusion, unspecified joint: Secondary | ICD-10-CM | POA: Diagnosis not present

## 2017-03-15 DIAGNOSIS — F0150 Vascular dementia without behavioral disturbance: Secondary | ICD-10-CM | POA: Diagnosis not present

## 2017-03-15 DIAGNOSIS — I672 Cerebral atherosclerosis: Secondary | ICD-10-CM | POA: Diagnosis not present

## 2017-03-15 DIAGNOSIS — N183 Chronic kidney disease, stage 3 (moderate): Secondary | ICD-10-CM | POA: Diagnosis not present

## 2017-03-16 ENCOUNTER — Telehealth: Payer: Self-pay | Admitting: *Deleted

## 2017-03-16 DIAGNOSIS — R1310 Dysphagia, unspecified: Secondary | ICD-10-CM | POA: Diagnosis not present

## 2017-03-16 DIAGNOSIS — I672 Cerebral atherosclerosis: Secondary | ICD-10-CM | POA: Diagnosis not present

## 2017-03-16 DIAGNOSIS — I10 Essential (primary) hypertension: Secondary | ICD-10-CM | POA: Diagnosis not present

## 2017-03-16 DIAGNOSIS — N183 Chronic kidney disease, stage 3 (moderate): Secondary | ICD-10-CM | POA: Diagnosis not present

## 2017-03-16 DIAGNOSIS — F0150 Vascular dementia without behavioral disturbance: Secondary | ICD-10-CM | POA: Diagnosis not present

## 2017-03-16 DIAGNOSIS — M2540 Effusion, unspecified joint: Secondary | ICD-10-CM | POA: Diagnosis not present

## 2017-03-16 NOTE — Telephone Encounter (Signed)
Amy Johnson with Hospice called and requested orders for Routine Tylenol due to patient complaining of pain. Would like an order for this. Please Advise.  Hospice patient.

## 2017-03-19 MED ORDER — ACETAMINOPHEN 325 MG PO TABS: 650.0000 mg | ORAL_TABLET | Freq: Three times a day (TID) | ORAL | 0 refills | Status: DC | PRN

## 2017-03-19 NOTE — Telephone Encounter (Signed)
Okay to use tylenol 650 mg by mouth every 8 hours scheduled for pain

## 2017-03-19 NOTE — Telephone Encounter (Signed)
Amy Notified. Medication list updated.

## 2017-03-20 DIAGNOSIS — I10 Essential (primary) hypertension: Secondary | ICD-10-CM | POA: Diagnosis not present

## 2017-03-20 DIAGNOSIS — N183 Chronic kidney disease, stage 3 (moderate): Secondary | ICD-10-CM | POA: Diagnosis not present

## 2017-03-20 DIAGNOSIS — R1310 Dysphagia, unspecified: Secondary | ICD-10-CM | POA: Diagnosis not present

## 2017-03-20 DIAGNOSIS — I672 Cerebral atherosclerosis: Secondary | ICD-10-CM | POA: Diagnosis not present

## 2017-03-20 DIAGNOSIS — F0150 Vascular dementia without behavioral disturbance: Secondary | ICD-10-CM | POA: Diagnosis not present

## 2017-03-20 DIAGNOSIS — M2540 Effusion, unspecified joint: Secondary | ICD-10-CM | POA: Diagnosis not present

## 2017-03-21 ENCOUNTER — Telehealth: Payer: Self-pay | Admitting: *Deleted

## 2017-03-21 DIAGNOSIS — R1310 Dysphagia, unspecified: Secondary | ICD-10-CM | POA: Diagnosis not present

## 2017-03-21 DIAGNOSIS — I10 Essential (primary) hypertension: Secondary | ICD-10-CM | POA: Diagnosis not present

## 2017-03-21 DIAGNOSIS — M2540 Effusion, unspecified joint: Secondary | ICD-10-CM | POA: Diagnosis not present

## 2017-03-21 DIAGNOSIS — N183 Chronic kidney disease, stage 3 (moderate): Secondary | ICD-10-CM | POA: Diagnosis not present

## 2017-03-21 DIAGNOSIS — I672 Cerebral atherosclerosis: Secondary | ICD-10-CM | POA: Diagnosis not present

## 2017-03-21 DIAGNOSIS — F0150 Vascular dementia without behavioral disturbance: Secondary | ICD-10-CM | POA: Diagnosis not present

## 2017-03-21 NOTE — Telephone Encounter (Signed)
Called and spoke with [place]. She stated to disregard message because she called the Hospice Dr. And got orders: Doxycyline 100mg  twice daily for 7 days.

## 2017-03-21 NOTE — Telephone Encounter (Signed)
Okay to start doxycyline 100 mg by mouth twice daily for 7 days, please send to pharmacy of choice #14/0 refills

## 2017-03-21 NOTE — Telephone Encounter (Signed)
[name] with Hospice called and stated that patient has new onset of edema in Left foot. Redness and warm to touch. 2nd toe has a place on it scabbed over. Nurse is concerned with cellulitis and wants a antibiotic order. Please Advise.

## 2017-03-22 DIAGNOSIS — F0150 Vascular dementia without behavioral disturbance: Secondary | ICD-10-CM | POA: Diagnosis not present

## 2017-03-22 DIAGNOSIS — I10 Essential (primary) hypertension: Secondary | ICD-10-CM | POA: Diagnosis not present

## 2017-03-22 DIAGNOSIS — N183 Chronic kidney disease, stage 3 (moderate): Secondary | ICD-10-CM | POA: Diagnosis not present

## 2017-03-22 DIAGNOSIS — I672 Cerebral atherosclerosis: Secondary | ICD-10-CM | POA: Diagnosis not present

## 2017-03-22 DIAGNOSIS — R1310 Dysphagia, unspecified: Secondary | ICD-10-CM | POA: Diagnosis not present

## 2017-03-22 DIAGNOSIS — M2540 Effusion, unspecified joint: Secondary | ICD-10-CM | POA: Diagnosis not present

## 2017-03-23 DIAGNOSIS — I10 Essential (primary) hypertension: Secondary | ICD-10-CM | POA: Diagnosis not present

## 2017-03-23 DIAGNOSIS — R1310 Dysphagia, unspecified: Secondary | ICD-10-CM | POA: Diagnosis not present

## 2017-03-23 DIAGNOSIS — M2540 Effusion, unspecified joint: Secondary | ICD-10-CM | POA: Diagnosis not present

## 2017-03-23 DIAGNOSIS — F0150 Vascular dementia without behavioral disturbance: Secondary | ICD-10-CM | POA: Diagnosis not present

## 2017-03-23 DIAGNOSIS — N183 Chronic kidney disease, stage 3 (moderate): Secondary | ICD-10-CM | POA: Diagnosis not present

## 2017-03-23 DIAGNOSIS — I672 Cerebral atherosclerosis: Secondary | ICD-10-CM | POA: Diagnosis not present

## 2017-03-27 DIAGNOSIS — N183 Chronic kidney disease, stage 3 (moderate): Secondary | ICD-10-CM | POA: Diagnosis not present

## 2017-03-27 DIAGNOSIS — M2540 Effusion, unspecified joint: Secondary | ICD-10-CM | POA: Diagnosis not present

## 2017-03-27 DIAGNOSIS — F0150 Vascular dementia without behavioral disturbance: Secondary | ICD-10-CM | POA: Diagnosis not present

## 2017-03-27 DIAGNOSIS — I10 Essential (primary) hypertension: Secondary | ICD-10-CM | POA: Diagnosis not present

## 2017-03-27 DIAGNOSIS — I672 Cerebral atherosclerosis: Secondary | ICD-10-CM | POA: Diagnosis not present

## 2017-03-27 DIAGNOSIS — R1310 Dysphagia, unspecified: Secondary | ICD-10-CM | POA: Diagnosis not present

## 2017-03-28 DIAGNOSIS — I672 Cerebral atherosclerosis: Secondary | ICD-10-CM | POA: Diagnosis not present

## 2017-03-28 DIAGNOSIS — N183 Chronic kidney disease, stage 3 (moderate): Secondary | ICD-10-CM | POA: Diagnosis not present

## 2017-03-28 DIAGNOSIS — M2540 Effusion, unspecified joint: Secondary | ICD-10-CM | POA: Diagnosis not present

## 2017-03-28 DIAGNOSIS — R1310 Dysphagia, unspecified: Secondary | ICD-10-CM | POA: Diagnosis not present

## 2017-03-28 DIAGNOSIS — F0150 Vascular dementia without behavioral disturbance: Secondary | ICD-10-CM | POA: Diagnosis not present

## 2017-03-28 DIAGNOSIS — I10 Essential (primary) hypertension: Secondary | ICD-10-CM | POA: Diagnosis not present

## 2017-03-29 DIAGNOSIS — R1310 Dysphagia, unspecified: Secondary | ICD-10-CM | POA: Diagnosis not present

## 2017-03-29 DIAGNOSIS — M2540 Effusion, unspecified joint: Secondary | ICD-10-CM | POA: Diagnosis not present

## 2017-03-29 DIAGNOSIS — N183 Chronic kidney disease, stage 3 (moderate): Secondary | ICD-10-CM | POA: Diagnosis not present

## 2017-03-29 DIAGNOSIS — I10 Essential (primary) hypertension: Secondary | ICD-10-CM | POA: Diagnosis not present

## 2017-03-29 DIAGNOSIS — F0150 Vascular dementia without behavioral disturbance: Secondary | ICD-10-CM | POA: Diagnosis not present

## 2017-03-29 DIAGNOSIS — I672 Cerebral atherosclerosis: Secondary | ICD-10-CM | POA: Diagnosis not present

## 2017-04-03 DIAGNOSIS — N183 Chronic kidney disease, stage 3 (moderate): Secondary | ICD-10-CM | POA: Diagnosis not present

## 2017-04-03 DIAGNOSIS — I10 Essential (primary) hypertension: Secondary | ICD-10-CM | POA: Diagnosis not present

## 2017-04-03 DIAGNOSIS — I672 Cerebral atherosclerosis: Secondary | ICD-10-CM | POA: Diagnosis not present

## 2017-04-03 DIAGNOSIS — R1310 Dysphagia, unspecified: Secondary | ICD-10-CM | POA: Diagnosis not present

## 2017-04-03 DIAGNOSIS — M2540 Effusion, unspecified joint: Secondary | ICD-10-CM | POA: Diagnosis not present

## 2017-04-03 DIAGNOSIS — F0150 Vascular dementia without behavioral disturbance: Secondary | ICD-10-CM | POA: Diagnosis not present

## 2017-04-04 DIAGNOSIS — R1310 Dysphagia, unspecified: Secondary | ICD-10-CM | POA: Diagnosis not present

## 2017-04-04 DIAGNOSIS — I672 Cerebral atherosclerosis: Secondary | ICD-10-CM | POA: Diagnosis not present

## 2017-04-04 DIAGNOSIS — F0150 Vascular dementia without behavioral disturbance: Secondary | ICD-10-CM | POA: Diagnosis not present

## 2017-04-04 DIAGNOSIS — I10 Essential (primary) hypertension: Secondary | ICD-10-CM | POA: Diagnosis not present

## 2017-04-04 DIAGNOSIS — N183 Chronic kidney disease, stage 3 (moderate): Secondary | ICD-10-CM | POA: Diagnosis not present

## 2017-04-04 DIAGNOSIS — M2540 Effusion, unspecified joint: Secondary | ICD-10-CM | POA: Diagnosis not present

## 2017-04-05 DIAGNOSIS — N183 Chronic kidney disease, stage 3 (moderate): Secondary | ICD-10-CM | POA: Diagnosis not present

## 2017-04-05 DIAGNOSIS — F0150 Vascular dementia without behavioral disturbance: Secondary | ICD-10-CM | POA: Diagnosis not present

## 2017-04-05 DIAGNOSIS — I10 Essential (primary) hypertension: Secondary | ICD-10-CM | POA: Diagnosis not present

## 2017-04-05 DIAGNOSIS — R1310 Dysphagia, unspecified: Secondary | ICD-10-CM | POA: Diagnosis not present

## 2017-04-05 DIAGNOSIS — I672 Cerebral atherosclerosis: Secondary | ICD-10-CM | POA: Diagnosis not present

## 2017-04-05 DIAGNOSIS — M2540 Effusion, unspecified joint: Secondary | ICD-10-CM | POA: Diagnosis not present

## 2017-04-06 DIAGNOSIS — I672 Cerebral atherosclerosis: Secondary | ICD-10-CM | POA: Diagnosis not present

## 2017-04-06 DIAGNOSIS — R54 Age-related physical debility: Secondary | ICD-10-CM | POA: Diagnosis not present

## 2017-04-06 DIAGNOSIS — M2540 Effusion, unspecified joint: Secondary | ICD-10-CM | POA: Diagnosis not present

## 2017-04-06 DIAGNOSIS — R1310 Dysphagia, unspecified: Secondary | ICD-10-CM | POA: Diagnosis not present

## 2017-04-06 DIAGNOSIS — H409 Unspecified glaucoma: Secondary | ICD-10-CM | POA: Diagnosis not present

## 2017-04-06 DIAGNOSIS — I10 Essential (primary) hypertension: Secondary | ICD-10-CM | POA: Diagnosis not present

## 2017-04-06 DIAGNOSIS — N183 Chronic kidney disease, stage 3 (moderate): Secondary | ICD-10-CM | POA: Diagnosis not present

## 2017-04-06 DIAGNOSIS — E039 Hypothyroidism, unspecified: Secondary | ICD-10-CM | POA: Diagnosis not present

## 2017-04-06 DIAGNOSIS — F0150 Vascular dementia without behavioral disturbance: Secondary | ICD-10-CM | POA: Diagnosis not present

## 2017-04-10 DIAGNOSIS — I10 Essential (primary) hypertension: Secondary | ICD-10-CM | POA: Diagnosis not present

## 2017-04-10 DIAGNOSIS — M2540 Effusion, unspecified joint: Secondary | ICD-10-CM | POA: Diagnosis not present

## 2017-04-10 DIAGNOSIS — R1310 Dysphagia, unspecified: Secondary | ICD-10-CM | POA: Diagnosis not present

## 2017-04-10 DIAGNOSIS — I672 Cerebral atherosclerosis: Secondary | ICD-10-CM | POA: Diagnosis not present

## 2017-04-10 DIAGNOSIS — F0150 Vascular dementia without behavioral disturbance: Secondary | ICD-10-CM | POA: Diagnosis not present

## 2017-04-10 DIAGNOSIS — N183 Chronic kidney disease, stage 3 (moderate): Secondary | ICD-10-CM | POA: Diagnosis not present

## 2017-04-13 DIAGNOSIS — N183 Chronic kidney disease, stage 3 (moderate): Secondary | ICD-10-CM | POA: Diagnosis not present

## 2017-04-13 DIAGNOSIS — F0150 Vascular dementia without behavioral disturbance: Secondary | ICD-10-CM | POA: Diagnosis not present

## 2017-04-13 DIAGNOSIS — M2540 Effusion, unspecified joint: Secondary | ICD-10-CM | POA: Diagnosis not present

## 2017-04-13 DIAGNOSIS — I672 Cerebral atherosclerosis: Secondary | ICD-10-CM | POA: Diagnosis not present

## 2017-04-13 DIAGNOSIS — I10 Essential (primary) hypertension: Secondary | ICD-10-CM | POA: Diagnosis not present

## 2017-04-13 DIAGNOSIS — R1310 Dysphagia, unspecified: Secondary | ICD-10-CM | POA: Diagnosis not present

## 2017-04-14 DIAGNOSIS — I672 Cerebral atherosclerosis: Secondary | ICD-10-CM | POA: Diagnosis not present

## 2017-04-14 DIAGNOSIS — R1310 Dysphagia, unspecified: Secondary | ICD-10-CM | POA: Diagnosis not present

## 2017-04-14 DIAGNOSIS — N183 Chronic kidney disease, stage 3 (moderate): Secondary | ICD-10-CM | POA: Diagnosis not present

## 2017-04-14 DIAGNOSIS — F0150 Vascular dementia without behavioral disturbance: Secondary | ICD-10-CM | POA: Diagnosis not present

## 2017-04-14 DIAGNOSIS — M2540 Effusion, unspecified joint: Secondary | ICD-10-CM | POA: Diagnosis not present

## 2017-04-14 DIAGNOSIS — I10 Essential (primary) hypertension: Secondary | ICD-10-CM | POA: Diagnosis not present

## 2017-04-17 DIAGNOSIS — I672 Cerebral atherosclerosis: Secondary | ICD-10-CM | POA: Diagnosis not present

## 2017-04-17 DIAGNOSIS — F0150 Vascular dementia without behavioral disturbance: Secondary | ICD-10-CM | POA: Diagnosis not present

## 2017-04-17 DIAGNOSIS — R1310 Dysphagia, unspecified: Secondary | ICD-10-CM | POA: Diagnosis not present

## 2017-04-17 DIAGNOSIS — N183 Chronic kidney disease, stage 3 (moderate): Secondary | ICD-10-CM | POA: Diagnosis not present

## 2017-04-17 DIAGNOSIS — M2540 Effusion, unspecified joint: Secondary | ICD-10-CM | POA: Diagnosis not present

## 2017-04-17 DIAGNOSIS — I10 Essential (primary) hypertension: Secondary | ICD-10-CM | POA: Diagnosis not present

## 2017-04-18 ENCOUNTER — Other Ambulatory Visit: Payer: Medicare Other

## 2017-04-18 DIAGNOSIS — E039 Hypothyroidism, unspecified: Secondary | ICD-10-CM

## 2017-04-18 LAB — TSH: TSH: 0.06 mIU/L — ABNORMAL LOW (ref 0.40–4.50)

## 2017-04-19 ENCOUNTER — Other Ambulatory Visit: Payer: Self-pay

## 2017-04-19 DIAGNOSIS — I10 Essential (primary) hypertension: Secondary | ICD-10-CM | POA: Diagnosis not present

## 2017-04-19 DIAGNOSIS — N183 Chronic kidney disease, stage 3 (moderate): Secondary | ICD-10-CM | POA: Diagnosis not present

## 2017-04-19 DIAGNOSIS — R1310 Dysphagia, unspecified: Secondary | ICD-10-CM | POA: Diagnosis not present

## 2017-04-19 DIAGNOSIS — M2540 Effusion, unspecified joint: Secondary | ICD-10-CM | POA: Diagnosis not present

## 2017-04-19 DIAGNOSIS — I672 Cerebral atherosclerosis: Secondary | ICD-10-CM | POA: Diagnosis not present

## 2017-04-19 DIAGNOSIS — E039 Hypothyroidism, unspecified: Secondary | ICD-10-CM

## 2017-04-19 DIAGNOSIS — F0150 Vascular dementia without behavioral disturbance: Secondary | ICD-10-CM | POA: Diagnosis not present

## 2017-04-19 MED ORDER — LEVOTHYROXINE SODIUM 175 MCG PO TABS: 175.0000 ug | ORAL_TABLET | Freq: Every day | ORAL | 3 refills | Status: DC

## 2017-04-19 NOTE — Telephone Encounter (Signed)
Medication list has been updated to reflect changes based on recent lab results. Rx was sent to pharmacy.

## 2017-04-20 ENCOUNTER — Telehealth: Payer: Self-pay | Admitting: *Deleted

## 2017-04-20 DIAGNOSIS — I10 Essential (primary) hypertension: Secondary | ICD-10-CM | POA: Diagnosis not present

## 2017-04-20 DIAGNOSIS — N183 Chronic kidney disease, stage 3 (moderate): Secondary | ICD-10-CM | POA: Diagnosis not present

## 2017-04-20 DIAGNOSIS — I672 Cerebral atherosclerosis: Secondary | ICD-10-CM | POA: Diagnosis not present

## 2017-04-20 DIAGNOSIS — F0150 Vascular dementia without behavioral disturbance: Secondary | ICD-10-CM | POA: Diagnosis not present

## 2017-04-20 DIAGNOSIS — R1310 Dysphagia, unspecified: Secondary | ICD-10-CM | POA: Diagnosis not present

## 2017-04-20 DIAGNOSIS — M2540 Effusion, unspecified joint: Secondary | ICD-10-CM | POA: Diagnosis not present

## 2017-04-20 NOTE — Telephone Encounter (Signed)
Joy with Hospice called and wanted to know the patient's TSH results. Results and changes given to nurse.

## 2017-04-23 DIAGNOSIS — M2540 Effusion, unspecified joint: Secondary | ICD-10-CM | POA: Diagnosis not present

## 2017-04-23 DIAGNOSIS — N183 Chronic kidney disease, stage 3 (moderate): Secondary | ICD-10-CM | POA: Diagnosis not present

## 2017-04-23 DIAGNOSIS — R1310 Dysphagia, unspecified: Secondary | ICD-10-CM | POA: Diagnosis not present

## 2017-04-23 DIAGNOSIS — I10 Essential (primary) hypertension: Secondary | ICD-10-CM | POA: Diagnosis not present

## 2017-04-23 DIAGNOSIS — I672 Cerebral atherosclerosis: Secondary | ICD-10-CM | POA: Diagnosis not present

## 2017-04-23 DIAGNOSIS — F0150 Vascular dementia without behavioral disturbance: Secondary | ICD-10-CM | POA: Diagnosis not present

## 2017-04-24 DIAGNOSIS — R1310 Dysphagia, unspecified: Secondary | ICD-10-CM | POA: Diagnosis not present

## 2017-04-24 DIAGNOSIS — I672 Cerebral atherosclerosis: Secondary | ICD-10-CM | POA: Diagnosis not present

## 2017-04-24 DIAGNOSIS — F0150 Vascular dementia without behavioral disturbance: Secondary | ICD-10-CM | POA: Diagnosis not present

## 2017-04-24 DIAGNOSIS — I10 Essential (primary) hypertension: Secondary | ICD-10-CM | POA: Diagnosis not present

## 2017-04-24 DIAGNOSIS — M2540 Effusion, unspecified joint: Secondary | ICD-10-CM | POA: Diagnosis not present

## 2017-04-24 DIAGNOSIS — N183 Chronic kidney disease, stage 3 (moderate): Secondary | ICD-10-CM | POA: Diagnosis not present

## 2017-04-26 DIAGNOSIS — R1310 Dysphagia, unspecified: Secondary | ICD-10-CM | POA: Diagnosis not present

## 2017-04-26 DIAGNOSIS — I672 Cerebral atherosclerosis: Secondary | ICD-10-CM | POA: Diagnosis not present

## 2017-04-26 DIAGNOSIS — F0150 Vascular dementia without behavioral disturbance: Secondary | ICD-10-CM | POA: Diagnosis not present

## 2017-04-26 DIAGNOSIS — N183 Chronic kidney disease, stage 3 (moderate): Secondary | ICD-10-CM | POA: Diagnosis not present

## 2017-04-26 DIAGNOSIS — I10 Essential (primary) hypertension: Secondary | ICD-10-CM | POA: Diagnosis not present

## 2017-04-26 DIAGNOSIS — M2540 Effusion, unspecified joint: Secondary | ICD-10-CM | POA: Diagnosis not present

## 2017-05-01 DIAGNOSIS — I10 Essential (primary) hypertension: Secondary | ICD-10-CM | POA: Diagnosis not present

## 2017-05-01 DIAGNOSIS — N183 Chronic kidney disease, stage 3 (moderate): Secondary | ICD-10-CM | POA: Diagnosis not present

## 2017-05-01 DIAGNOSIS — R1310 Dysphagia, unspecified: Secondary | ICD-10-CM | POA: Diagnosis not present

## 2017-05-01 DIAGNOSIS — M2540 Effusion, unspecified joint: Secondary | ICD-10-CM | POA: Diagnosis not present

## 2017-05-01 DIAGNOSIS — F0150 Vascular dementia without behavioral disturbance: Secondary | ICD-10-CM | POA: Diagnosis not present

## 2017-05-01 DIAGNOSIS — I672 Cerebral atherosclerosis: Secondary | ICD-10-CM | POA: Diagnosis not present

## 2017-05-03 DIAGNOSIS — N183 Chronic kidney disease, stage 3 (moderate): Secondary | ICD-10-CM | POA: Diagnosis not present

## 2017-05-03 DIAGNOSIS — R1310 Dysphagia, unspecified: Secondary | ICD-10-CM | POA: Diagnosis not present

## 2017-05-03 DIAGNOSIS — M2540 Effusion, unspecified joint: Secondary | ICD-10-CM | POA: Diagnosis not present

## 2017-05-03 DIAGNOSIS — F0150 Vascular dementia without behavioral disturbance: Secondary | ICD-10-CM | POA: Diagnosis not present

## 2017-05-03 DIAGNOSIS — I10 Essential (primary) hypertension: Secondary | ICD-10-CM | POA: Diagnosis not present

## 2017-05-03 DIAGNOSIS — I672 Cerebral atherosclerosis: Secondary | ICD-10-CM | POA: Diagnosis not present

## 2017-05-04 DIAGNOSIS — E039 Hypothyroidism, unspecified: Secondary | ICD-10-CM | POA: Diagnosis not present

## 2017-05-04 DIAGNOSIS — H409 Unspecified glaucoma: Secondary | ICD-10-CM | POA: Diagnosis not present

## 2017-05-04 DIAGNOSIS — M2540 Effusion, unspecified joint: Secondary | ICD-10-CM | POA: Diagnosis not present

## 2017-05-04 DIAGNOSIS — I672 Cerebral atherosclerosis: Secondary | ICD-10-CM | POA: Diagnosis not present

## 2017-05-04 DIAGNOSIS — R54 Age-related physical debility: Secondary | ICD-10-CM | POA: Diagnosis not present

## 2017-05-04 DIAGNOSIS — I10 Essential (primary) hypertension: Secondary | ICD-10-CM | POA: Diagnosis not present

## 2017-05-04 DIAGNOSIS — F0150 Vascular dementia without behavioral disturbance: Secondary | ICD-10-CM | POA: Diagnosis not present

## 2017-05-04 DIAGNOSIS — N183 Chronic kidney disease, stage 3 (moderate): Secondary | ICD-10-CM | POA: Diagnosis not present

## 2017-05-04 DIAGNOSIS — R1310 Dysphagia, unspecified: Secondary | ICD-10-CM | POA: Diagnosis not present

## 2017-05-07 DIAGNOSIS — I672 Cerebral atherosclerosis: Secondary | ICD-10-CM | POA: Diagnosis not present

## 2017-05-07 DIAGNOSIS — F0150 Vascular dementia without behavioral disturbance: Secondary | ICD-10-CM | POA: Diagnosis not present

## 2017-05-07 DIAGNOSIS — M2540 Effusion, unspecified joint: Secondary | ICD-10-CM | POA: Diagnosis not present

## 2017-05-07 DIAGNOSIS — N183 Chronic kidney disease, stage 3 (moderate): Secondary | ICD-10-CM | POA: Diagnosis not present

## 2017-05-07 DIAGNOSIS — I10 Essential (primary) hypertension: Secondary | ICD-10-CM | POA: Diagnosis not present

## 2017-05-07 DIAGNOSIS — R1310 Dysphagia, unspecified: Secondary | ICD-10-CM | POA: Diagnosis not present

## 2017-05-08 DIAGNOSIS — N183 Chronic kidney disease, stage 3 (moderate): Secondary | ICD-10-CM | POA: Diagnosis not present

## 2017-05-08 DIAGNOSIS — F0150 Vascular dementia without behavioral disturbance: Secondary | ICD-10-CM | POA: Diagnosis not present

## 2017-05-08 DIAGNOSIS — I672 Cerebral atherosclerosis: Secondary | ICD-10-CM | POA: Diagnosis not present

## 2017-05-08 DIAGNOSIS — R1310 Dysphagia, unspecified: Secondary | ICD-10-CM | POA: Diagnosis not present

## 2017-05-08 DIAGNOSIS — M2540 Effusion, unspecified joint: Secondary | ICD-10-CM | POA: Diagnosis not present

## 2017-05-08 DIAGNOSIS — I10 Essential (primary) hypertension: Secondary | ICD-10-CM | POA: Diagnosis not present

## 2017-05-09 DIAGNOSIS — M2540 Effusion, unspecified joint: Secondary | ICD-10-CM | POA: Diagnosis not present

## 2017-05-09 DIAGNOSIS — I672 Cerebral atherosclerosis: Secondary | ICD-10-CM | POA: Diagnosis not present

## 2017-05-09 DIAGNOSIS — N183 Chronic kidney disease, stage 3 (moderate): Secondary | ICD-10-CM | POA: Diagnosis not present

## 2017-05-09 DIAGNOSIS — F0150 Vascular dementia without behavioral disturbance: Secondary | ICD-10-CM | POA: Diagnosis not present

## 2017-05-09 DIAGNOSIS — I10 Essential (primary) hypertension: Secondary | ICD-10-CM | POA: Diagnosis not present

## 2017-05-09 DIAGNOSIS — R1310 Dysphagia, unspecified: Secondary | ICD-10-CM | POA: Diagnosis not present

## 2017-05-10 DIAGNOSIS — I672 Cerebral atherosclerosis: Secondary | ICD-10-CM | POA: Diagnosis not present

## 2017-05-10 DIAGNOSIS — I10 Essential (primary) hypertension: Secondary | ICD-10-CM | POA: Diagnosis not present

## 2017-05-10 DIAGNOSIS — N183 Chronic kidney disease, stage 3 (moderate): Secondary | ICD-10-CM | POA: Diagnosis not present

## 2017-05-10 DIAGNOSIS — R1310 Dysphagia, unspecified: Secondary | ICD-10-CM | POA: Diagnosis not present

## 2017-05-10 DIAGNOSIS — M2540 Effusion, unspecified joint: Secondary | ICD-10-CM | POA: Diagnosis not present

## 2017-05-10 DIAGNOSIS — F0150 Vascular dementia without behavioral disturbance: Secondary | ICD-10-CM | POA: Diagnosis not present

## 2017-05-12 DIAGNOSIS — I10 Essential (primary) hypertension: Secondary | ICD-10-CM | POA: Diagnosis not present

## 2017-05-12 DIAGNOSIS — I672 Cerebral atherosclerosis: Secondary | ICD-10-CM | POA: Diagnosis not present

## 2017-05-12 DIAGNOSIS — R1310 Dysphagia, unspecified: Secondary | ICD-10-CM | POA: Diagnosis not present

## 2017-05-12 DIAGNOSIS — M2540 Effusion, unspecified joint: Secondary | ICD-10-CM | POA: Diagnosis not present

## 2017-05-12 DIAGNOSIS — N183 Chronic kidney disease, stage 3 (moderate): Secondary | ICD-10-CM | POA: Diagnosis not present

## 2017-05-12 DIAGNOSIS — F0150 Vascular dementia without behavioral disturbance: Secondary | ICD-10-CM | POA: Diagnosis not present

## 2017-05-14 DIAGNOSIS — I672 Cerebral atherosclerosis: Secondary | ICD-10-CM | POA: Diagnosis not present

## 2017-05-14 DIAGNOSIS — N183 Chronic kidney disease, stage 3 (moderate): Secondary | ICD-10-CM | POA: Diagnosis not present

## 2017-05-14 DIAGNOSIS — F0150 Vascular dementia without behavioral disturbance: Secondary | ICD-10-CM | POA: Diagnosis not present

## 2017-05-14 DIAGNOSIS — I10 Essential (primary) hypertension: Secondary | ICD-10-CM | POA: Diagnosis not present

## 2017-05-14 DIAGNOSIS — M2540 Effusion, unspecified joint: Secondary | ICD-10-CM | POA: Diagnosis not present

## 2017-05-14 DIAGNOSIS — R1310 Dysphagia, unspecified: Secondary | ICD-10-CM | POA: Diagnosis not present

## 2017-05-15 DIAGNOSIS — F0150 Vascular dementia without behavioral disturbance: Secondary | ICD-10-CM | POA: Diagnosis not present

## 2017-05-15 DIAGNOSIS — I672 Cerebral atherosclerosis: Secondary | ICD-10-CM | POA: Diagnosis not present

## 2017-05-15 DIAGNOSIS — M2540 Effusion, unspecified joint: Secondary | ICD-10-CM | POA: Diagnosis not present

## 2017-05-15 DIAGNOSIS — R1310 Dysphagia, unspecified: Secondary | ICD-10-CM | POA: Diagnosis not present

## 2017-05-15 DIAGNOSIS — I10 Essential (primary) hypertension: Secondary | ICD-10-CM | POA: Diagnosis not present

## 2017-05-15 DIAGNOSIS — N183 Chronic kidney disease, stage 3 (moderate): Secondary | ICD-10-CM | POA: Diagnosis not present

## 2017-05-17 DIAGNOSIS — F0150 Vascular dementia without behavioral disturbance: Secondary | ICD-10-CM | POA: Diagnosis not present

## 2017-05-17 DIAGNOSIS — I10 Essential (primary) hypertension: Secondary | ICD-10-CM | POA: Diagnosis not present

## 2017-05-17 DIAGNOSIS — R1310 Dysphagia, unspecified: Secondary | ICD-10-CM | POA: Diagnosis not present

## 2017-05-17 DIAGNOSIS — N183 Chronic kidney disease, stage 3 (moderate): Secondary | ICD-10-CM | POA: Diagnosis not present

## 2017-05-17 DIAGNOSIS — M2540 Effusion, unspecified joint: Secondary | ICD-10-CM | POA: Diagnosis not present

## 2017-05-17 DIAGNOSIS — I672 Cerebral atherosclerosis: Secondary | ICD-10-CM | POA: Diagnosis not present

## 2017-05-21 ENCOUNTER — Telehealth: Payer: Self-pay | Admitting: *Deleted

## 2017-05-21 NOTE — Telephone Encounter (Signed)
A letter was printed with the lab instructions from 04/19/17 result note.   I spoke with [name] and informed her that I was faxing a letter with medication changes to her at [phone].

## 2017-05-21 NOTE — Telephone Encounter (Signed)
[name] with [facility] called and left message on Clinical Intake requesting to speak with Coralyn Mark. Stated that she received a fax from our office stating to decrease patient's Synthroid to 120mcg but the order is not signed by the provider so she cannot use it to change the order. Needs signed order faxed back to their facility. Stated that the family has picked up the medication. Please Advise.

## 2017-05-22 ENCOUNTER — Ambulatory Visit: Payer: Medicare Other | Admitting: Nurse Practitioner

## 2017-05-22 DIAGNOSIS — F0150 Vascular dementia without behavioral disturbance: Secondary | ICD-10-CM | POA: Diagnosis not present

## 2017-05-22 DIAGNOSIS — N183 Chronic kidney disease, stage 3 (moderate): Secondary | ICD-10-CM | POA: Diagnosis not present

## 2017-05-22 DIAGNOSIS — I672 Cerebral atherosclerosis: Secondary | ICD-10-CM | POA: Diagnosis not present

## 2017-05-22 DIAGNOSIS — M2540 Effusion, unspecified joint: Secondary | ICD-10-CM | POA: Diagnosis not present

## 2017-05-22 DIAGNOSIS — I10 Essential (primary) hypertension: Secondary | ICD-10-CM | POA: Diagnosis not present

## 2017-05-22 DIAGNOSIS — R1310 Dysphagia, unspecified: Secondary | ICD-10-CM | POA: Diagnosis not present

## 2017-05-23 DIAGNOSIS — I672 Cerebral atherosclerosis: Secondary | ICD-10-CM | POA: Diagnosis not present

## 2017-05-23 DIAGNOSIS — M2540 Effusion, unspecified joint: Secondary | ICD-10-CM | POA: Diagnosis not present

## 2017-05-23 DIAGNOSIS — N183 Chronic kidney disease, stage 3 (moderate): Secondary | ICD-10-CM | POA: Diagnosis not present

## 2017-05-23 DIAGNOSIS — R1310 Dysphagia, unspecified: Secondary | ICD-10-CM | POA: Diagnosis not present

## 2017-05-23 DIAGNOSIS — I10 Essential (primary) hypertension: Secondary | ICD-10-CM | POA: Diagnosis not present

## 2017-05-23 DIAGNOSIS — F0150 Vascular dementia without behavioral disturbance: Secondary | ICD-10-CM | POA: Diagnosis not present

## 2017-05-24 DIAGNOSIS — F0150 Vascular dementia without behavioral disturbance: Secondary | ICD-10-CM | POA: Diagnosis not present

## 2017-05-24 DIAGNOSIS — M2540 Effusion, unspecified joint: Secondary | ICD-10-CM | POA: Diagnosis not present

## 2017-05-24 DIAGNOSIS — I672 Cerebral atherosclerosis: Secondary | ICD-10-CM | POA: Diagnosis not present

## 2017-05-24 DIAGNOSIS — N183 Chronic kidney disease, stage 3 (moderate): Secondary | ICD-10-CM | POA: Diagnosis not present

## 2017-05-24 DIAGNOSIS — I10 Essential (primary) hypertension: Secondary | ICD-10-CM | POA: Diagnosis not present

## 2017-05-24 DIAGNOSIS — R1310 Dysphagia, unspecified: Secondary | ICD-10-CM | POA: Diagnosis not present

## 2017-05-25 DIAGNOSIS — R1310 Dysphagia, unspecified: Secondary | ICD-10-CM | POA: Diagnosis not present

## 2017-05-25 DIAGNOSIS — F0150 Vascular dementia without behavioral disturbance: Secondary | ICD-10-CM | POA: Diagnosis not present

## 2017-05-25 DIAGNOSIS — I672 Cerebral atherosclerosis: Secondary | ICD-10-CM | POA: Diagnosis not present

## 2017-05-25 DIAGNOSIS — N183 Chronic kidney disease, stage 3 (moderate): Secondary | ICD-10-CM | POA: Diagnosis not present

## 2017-05-25 DIAGNOSIS — I10 Essential (primary) hypertension: Secondary | ICD-10-CM | POA: Diagnosis not present

## 2017-05-25 DIAGNOSIS — M2540 Effusion, unspecified joint: Secondary | ICD-10-CM | POA: Diagnosis not present

## 2017-05-28 ENCOUNTER — Telehealth: Payer: Self-pay | Admitting: *Deleted

## 2017-05-28 DIAGNOSIS — M2540 Effusion, unspecified joint: Secondary | ICD-10-CM | POA: Diagnosis not present

## 2017-05-28 DIAGNOSIS — I672 Cerebral atherosclerosis: Secondary | ICD-10-CM | POA: Diagnosis not present

## 2017-05-28 DIAGNOSIS — N183 Chronic kidney disease, stage 3 (moderate): Secondary | ICD-10-CM | POA: Diagnosis not present

## 2017-05-28 DIAGNOSIS — I10 Essential (primary) hypertension: Secondary | ICD-10-CM | POA: Diagnosis not present

## 2017-05-28 DIAGNOSIS — F0150 Vascular dementia without behavioral disturbance: Secondary | ICD-10-CM | POA: Diagnosis not present

## 2017-05-28 DIAGNOSIS — R1310 Dysphagia, unspecified: Secondary | ICD-10-CM | POA: Diagnosis not present

## 2017-05-28 NOTE — Telephone Encounter (Signed)
Jill Gutierrez with [facility] called and left message on Clinical Intake stating to return her call.   I tried calling her back and was placed on hold 3 times and then disconnected.   Jill Gutierrez called back and stated that she received the standing order and diet order but the signature is cut off. Needs resigned. Refaxing.

## 2017-05-29 DIAGNOSIS — I10 Essential (primary) hypertension: Secondary | ICD-10-CM | POA: Diagnosis not present

## 2017-05-29 DIAGNOSIS — I672 Cerebral atherosclerosis: Secondary | ICD-10-CM | POA: Diagnosis not present

## 2017-05-29 DIAGNOSIS — R1310 Dysphagia, unspecified: Secondary | ICD-10-CM | POA: Diagnosis not present

## 2017-05-29 DIAGNOSIS — N183 Chronic kidney disease, stage 3 (moderate): Secondary | ICD-10-CM | POA: Diagnosis not present

## 2017-05-29 DIAGNOSIS — F0150 Vascular dementia without behavioral disturbance: Secondary | ICD-10-CM | POA: Diagnosis not present

## 2017-05-29 DIAGNOSIS — M2540 Effusion, unspecified joint: Secondary | ICD-10-CM | POA: Diagnosis not present

## 2017-05-31 DIAGNOSIS — F0150 Vascular dementia without behavioral disturbance: Secondary | ICD-10-CM | POA: Diagnosis not present

## 2017-05-31 DIAGNOSIS — R1310 Dysphagia, unspecified: Secondary | ICD-10-CM | POA: Diagnosis not present

## 2017-05-31 DIAGNOSIS — M2540 Effusion, unspecified joint: Secondary | ICD-10-CM | POA: Diagnosis not present

## 2017-05-31 DIAGNOSIS — I672 Cerebral atherosclerosis: Secondary | ICD-10-CM | POA: Diagnosis not present

## 2017-05-31 DIAGNOSIS — N183 Chronic kidney disease, stage 3 (moderate): Secondary | ICD-10-CM | POA: Diagnosis not present

## 2017-05-31 DIAGNOSIS — I10 Essential (primary) hypertension: Secondary | ICD-10-CM | POA: Diagnosis not present

## 2017-06-04 DIAGNOSIS — E039 Hypothyroidism, unspecified: Secondary | ICD-10-CM | POA: Diagnosis not present

## 2017-06-04 DIAGNOSIS — F0150 Vascular dementia without behavioral disturbance: Secondary | ICD-10-CM | POA: Diagnosis not present

## 2017-06-04 DIAGNOSIS — M2540 Effusion, unspecified joint: Secondary | ICD-10-CM | POA: Diagnosis not present

## 2017-06-04 DIAGNOSIS — I672 Cerebral atherosclerosis: Secondary | ICD-10-CM | POA: Diagnosis not present

## 2017-06-04 DIAGNOSIS — N183 Chronic kidney disease, stage 3 (moderate): Secondary | ICD-10-CM | POA: Diagnosis not present

## 2017-06-04 DIAGNOSIS — H409 Unspecified glaucoma: Secondary | ICD-10-CM | POA: Diagnosis not present

## 2017-06-04 DIAGNOSIS — R1310 Dysphagia, unspecified: Secondary | ICD-10-CM | POA: Diagnosis not present

## 2017-06-04 DIAGNOSIS — I10 Essential (primary) hypertension: Secondary | ICD-10-CM | POA: Diagnosis not present

## 2017-06-04 DIAGNOSIS — R54 Age-related physical debility: Secondary | ICD-10-CM | POA: Diagnosis not present

## 2017-06-05 ENCOUNTER — Ambulatory Visit: Payer: Medicare Other

## 2017-06-05 ENCOUNTER — Ambulatory Visit: Payer: Self-pay | Admitting: Nurse Practitioner

## 2017-06-05 DIAGNOSIS — I10 Essential (primary) hypertension: Secondary | ICD-10-CM | POA: Diagnosis not present

## 2017-06-05 DIAGNOSIS — R1310 Dysphagia, unspecified: Secondary | ICD-10-CM | POA: Diagnosis not present

## 2017-06-05 DIAGNOSIS — I672 Cerebral atherosclerosis: Secondary | ICD-10-CM | POA: Diagnosis not present

## 2017-06-05 DIAGNOSIS — M2540 Effusion, unspecified joint: Secondary | ICD-10-CM | POA: Diagnosis not present

## 2017-06-05 DIAGNOSIS — F0150 Vascular dementia without behavioral disturbance: Secondary | ICD-10-CM | POA: Diagnosis not present

## 2017-06-05 DIAGNOSIS — N183 Chronic kidney disease, stage 3 (moderate): Secondary | ICD-10-CM | POA: Diagnosis not present

## 2017-06-06 DIAGNOSIS — F0150 Vascular dementia without behavioral disturbance: Secondary | ICD-10-CM | POA: Diagnosis not present

## 2017-06-06 DIAGNOSIS — I10 Essential (primary) hypertension: Secondary | ICD-10-CM | POA: Diagnosis not present

## 2017-06-06 DIAGNOSIS — N183 Chronic kidney disease, stage 3 (moderate): Secondary | ICD-10-CM | POA: Diagnosis not present

## 2017-06-06 DIAGNOSIS — I672 Cerebral atherosclerosis: Secondary | ICD-10-CM | POA: Diagnosis not present

## 2017-06-06 DIAGNOSIS — R1310 Dysphagia, unspecified: Secondary | ICD-10-CM | POA: Diagnosis not present

## 2017-06-06 DIAGNOSIS — M2540 Effusion, unspecified joint: Secondary | ICD-10-CM | POA: Diagnosis not present

## 2017-06-07 DIAGNOSIS — I10 Essential (primary) hypertension: Secondary | ICD-10-CM | POA: Diagnosis not present

## 2017-06-07 DIAGNOSIS — M2540 Effusion, unspecified joint: Secondary | ICD-10-CM | POA: Diagnosis not present

## 2017-06-07 DIAGNOSIS — F0150 Vascular dementia without behavioral disturbance: Secondary | ICD-10-CM | POA: Diagnosis not present

## 2017-06-07 DIAGNOSIS — N183 Chronic kidney disease, stage 3 (moderate): Secondary | ICD-10-CM | POA: Diagnosis not present

## 2017-06-07 DIAGNOSIS — I672 Cerebral atherosclerosis: Secondary | ICD-10-CM | POA: Diagnosis not present

## 2017-06-07 DIAGNOSIS — R1310 Dysphagia, unspecified: Secondary | ICD-10-CM | POA: Diagnosis not present

## 2017-06-11 DIAGNOSIS — R1310 Dysphagia, unspecified: Secondary | ICD-10-CM | POA: Diagnosis not present

## 2017-06-11 DIAGNOSIS — N183 Chronic kidney disease, stage 3 (moderate): Secondary | ICD-10-CM | POA: Diagnosis not present

## 2017-06-11 DIAGNOSIS — I10 Essential (primary) hypertension: Secondary | ICD-10-CM | POA: Diagnosis not present

## 2017-06-11 DIAGNOSIS — I672 Cerebral atherosclerosis: Secondary | ICD-10-CM | POA: Diagnosis not present

## 2017-06-11 DIAGNOSIS — M2540 Effusion, unspecified joint: Secondary | ICD-10-CM | POA: Diagnosis not present

## 2017-06-11 DIAGNOSIS — F0150 Vascular dementia without behavioral disturbance: Secondary | ICD-10-CM | POA: Diagnosis not present

## 2017-06-12 DIAGNOSIS — R1310 Dysphagia, unspecified: Secondary | ICD-10-CM | POA: Diagnosis not present

## 2017-06-12 DIAGNOSIS — F0150 Vascular dementia without behavioral disturbance: Secondary | ICD-10-CM | POA: Diagnosis not present

## 2017-06-12 DIAGNOSIS — I10 Essential (primary) hypertension: Secondary | ICD-10-CM | POA: Diagnosis not present

## 2017-06-12 DIAGNOSIS — N183 Chronic kidney disease, stage 3 (moderate): Secondary | ICD-10-CM | POA: Diagnosis not present

## 2017-06-12 DIAGNOSIS — I672 Cerebral atherosclerosis: Secondary | ICD-10-CM | POA: Diagnosis not present

## 2017-06-12 DIAGNOSIS — M2540 Effusion, unspecified joint: Secondary | ICD-10-CM | POA: Diagnosis not present

## 2017-06-13 DIAGNOSIS — I10 Essential (primary) hypertension: Secondary | ICD-10-CM | POA: Diagnosis not present

## 2017-06-13 DIAGNOSIS — F0150 Vascular dementia without behavioral disturbance: Secondary | ICD-10-CM | POA: Diagnosis not present

## 2017-06-13 DIAGNOSIS — I672 Cerebral atherosclerosis: Secondary | ICD-10-CM | POA: Diagnosis not present

## 2017-06-13 DIAGNOSIS — R1310 Dysphagia, unspecified: Secondary | ICD-10-CM | POA: Diagnosis not present

## 2017-06-13 DIAGNOSIS — N183 Chronic kidney disease, stage 3 (moderate): Secondary | ICD-10-CM | POA: Diagnosis not present

## 2017-06-13 DIAGNOSIS — M2540 Effusion, unspecified joint: Secondary | ICD-10-CM | POA: Diagnosis not present

## 2017-06-14 ENCOUNTER — Ambulatory Visit: Payer: Medicare Other | Admitting: Nurse Practitioner

## 2017-06-14 ENCOUNTER — Other Ambulatory Visit: Payer: Medicare Other

## 2017-06-14 ENCOUNTER — Ambulatory Visit (INDEPENDENT_AMBULATORY_CARE_PROVIDER_SITE_OTHER): Payer: Medicare Other

## 2017-06-14 ENCOUNTER — Encounter: Payer: Self-pay | Admitting: Nurse Practitioner

## 2017-06-14 VITALS — BP 122/51 | HR 64 | Temp 98.0°F | Ht 60.0 in

## 2017-06-14 VITALS — BP 122/51 | HR 64 | Temp 98.0°F

## 2017-06-14 DIAGNOSIS — I10 Essential (primary) hypertension: Secondary | ICD-10-CM

## 2017-06-14 DIAGNOSIS — Z0000 Encounter for general adult medical examination without abnormal findings: Secondary | ICD-10-CM | POA: Diagnosis not present

## 2017-06-14 DIAGNOSIS — M810 Age-related osteoporosis without current pathological fracture: Secondary | ICD-10-CM

## 2017-06-14 DIAGNOSIS — M1990 Unspecified osteoarthritis, unspecified site: Secondary | ICD-10-CM | POA: Diagnosis not present

## 2017-06-14 DIAGNOSIS — F0150 Vascular dementia without behavioral disturbance: Secondary | ICD-10-CM | POA: Diagnosis not present

## 2017-06-14 DIAGNOSIS — E039 Hypothyroidism, unspecified: Secondary | ICD-10-CM

## 2017-06-14 DIAGNOSIS — Z23 Encounter for immunization: Secondary | ICD-10-CM | POA: Diagnosis not present

## 2017-06-14 NOTE — Patient Instructions (Addendum)
Jill Gutierrez , Thank you for taking time to come for your Medicare Wellness Visit. I appreciate your ongoing commitment to your health goals. Please review the following plan we discussed and let me know if I can assist you in the future.   Screening recommendations/referrals: Colonoscopy excluded, you are over age 82 Mammogram excluded, you are over age 65 Bone Density up to date Recommended yearly ophthalmology/optometry visit for glaucoma screening and checkup Recommended yearly dental visit for hygiene and checkup  Vaccinations: Influenza vaccine up to date, due 2019 fall season Pneumococcal vaccine 13 given today.  Tdap vaccine up to date, due 11/14/2026 Shingles vaccine due, declined    Advanced directives: Please discuss with provider and bring [place] a copy if you get it filled out  Conditions/risks identified: none  Next appointment: Jill Dense, RN 06/17/2018 @ 12:45pm   Preventive Care 65 Years and Older, Female Preventive care refers to lifestyle choices and visits with your health care provider that can promote health and wellness. What does preventive care include?  A yearly physical exam. This is also called an annual well check.  Dental exams once or twice a year.  Routine eye exams. Ask your health care provider how often you should have your eyes checked.  Personal lifestyle choices, including:  Daily care of your teeth and gums.  Regular physical activity.  Eating a healthy diet.  Avoiding tobacco and drug use.  Limiting alcohol use.  Practicing safe sex.  Taking low-dose aspirin every day.  Taking vitamin and mineral supplements as recommended by your health care provider. What happens during an annual well check? The services and screenings done by your health care provider during your annual well check will depend on your age, overall health, lifestyle risk factors, and family history of disease. Counseling  Your health care provider may ask you  questions about your:  Alcohol use.  Tobacco use.  Drug use.  Emotional well-being.  Home and relationship well-being.  Sexual activity.  Eating habits.  History of falls.  Memory and ability to understand (cognition).  Work and work Statistician.  Reproductive health. Screening  You may have the following tests or measurements:  Height, weight, and BMI.  Blood pressure.  Lipid and cholesterol levels. These may be checked every 5 years, or more frequently if you are over 73 years old.  Skin check.  Lung cancer screening. You may have this screening every year starting at age 40 if you have a 30-pack-year history of smoking and currently smoke or have quit within the past 15 years.  Fecal occult blood test (FOBT) of the stool. You may have this test every year starting at age 24.  Flexible sigmoidoscopy or colonoscopy. You may have a sigmoidoscopy every 5 years or a colonoscopy every 10 years starting at age 37.  Hepatitis C blood test.  Hepatitis B blood test.  Sexually transmitted disease (STD) testing.  Diabetes screening. This is done by checking your blood sugar (glucose) after you have not eaten for a while (fasting). You may have this done every 1-3 years.  Bone density scan. This is done to screen for osteoporosis. You may have this done starting at age 69.  Mammogram. This may be done every 1-2 years. Talk to your health care provider about how often you should have regular mammograms. Talk with your health care provider about your test results, treatment options, and if necessary, the need for more tests. Vaccines  Your health care provider may recommend certain vaccines,  such as:  Influenza vaccine. This is recommended every year.  Tetanus, diphtheria, and acellular pertussis (Tdap, Td) vaccine. You may need a Td booster every 10 years.  Zoster vaccine. You may need this after age 66.  Pneumococcal 13-valent conjugate (PCV13) vaccine. One dose is  recommended after age 28.  Pneumococcal polysaccharide (PPSV23) vaccine. One dose is recommended after age 72. Talk to your health care provider about which screenings and vaccines you need and how often you need them. This information is not intended to replace advice given to you by your health care provider. Make sure you discuss any questions you have with your health care provider. Document Released: 03/19/2015 Document Revised: 11/10/2015 Document Reviewed: 12/22/2014 Elsevier Interactive Patient Education  2017 [place] Prevention in the Home Falls can cause injuries. They can happen to people of all ages. There are many things you can do to make your home safe and to help prevent falls. What can I do on the outside of my home?  Regularly fix the edges of walkways and driveways and fix any cracks.  Remove anything that might make you trip as you walk through a door, such as a raised step or threshold.  Trim any bushes or trees on the path to your home.  Use bright outdoor lighting.  Clear any walking paths of anything that might make someone trip, such as rocks or tools.  Regularly check to see if handrails are loose or broken. Make sure that both sides of any steps have handrails.  Any raised decks and porches should have guardrails on the edges.  Have any leaves, snow, or ice cleared regularly.  Use sand or salt on walking paths during winter.  Clean up any spills in your garage right away. This includes oil or grease spills. What can I do in the bathroom?  Use night lights.  Install grab bars by the toilet and in the tub and shower. Do not use towel bars as grab bars.  Use non-skid mats or decals in the tub or shower.  If you need to sit down in the shower, use a plastic, non-slip stool.  Keep the floor dry. Clean up any water that spills on the floor as soon as it happens.  Remove soap buildup in the tub or shower regularly.  Attach bath mats  securely with double-sided non-slip rug tape.  Do not have throw rugs and other things on the floor that can make you trip. What can I do in the bedroom?  Use night lights.  Make sure that you have a light by your bed that is easy to reach.  Do not use any sheets or blankets that are too big for your bed. They should not hang down onto the floor.  Have a firm chair that has side arms. You can use this for support while you get dressed.  Do not have throw rugs and other things on the floor that can make you trip. What can I do in the kitchen?  Clean up any spills right away.  Avoid walking on wet floors.  Keep items that you use a lot in easy-to-reach places.  If you need to reach something above you, use a strong step stool that has a grab bar.  Keep electrical cords out of the way.  Do not use floor polish or wax that makes floors slippery. If you must use wax, use non-skid floor wax.  Do not have throw rugs and other things on  the floor that can make you trip. What can I do with my stairs?  Do not leave any items on the stairs.  Make sure that there are handrails on both sides of the stairs and use them. Fix handrails that are broken or loose. Make sure that handrails are as long as the stairways.  Check any carpeting to make sure that it is firmly attached to the stairs. Fix any carpet that is loose or worn.  Avoid having throw rugs at the top or bottom of the stairs. If you do have throw rugs, attach them to the floor with carpet tape.  Make sure that you have a light switch at the top of the stairs and the bottom of the stairs. If you do not have them, ask someone to add them for you. What else can I do to help prevent falls?  Wear shoes that:  Do not have high heels.  Have rubber bottoms.  Are comfortable and fit you well.  Are closed at the toe. Do not wear sandals.  If you use a stepladder:  Make sure that it is fully opened. Do not climb a closed  stepladder.  Make sure that both sides of the stepladder are locked into place.  Ask someone to hold it for you, if possible.  Clearly mark and make sure that you can see:  Any grab bars or handrails.  First and last steps.  Where the edge of each step is.  Use tools that help you move around (mobility aids) if they are needed. These include:  Canes.  Walkers.  Scooters.  Crutches.  Turn on the lights when you go into a dark area. Replace any light bulbs as soon as they burn out.  Set up your furniture so you have a clear path. Avoid moving your furniture around.  If any of your floors are uneven, fix them.  If there are any pets around you, be aware of where they are.  Review your medicines with your doctor. Some medicines can make you feel dizzy. This can increase your chance of falling. Ask your doctor what other things that you can do to help prevent falls. This information is not intended to replace advice given to you by your health care provider. Make sure you discuss any questions you have with your health care provider. Document Released: 12/17/2008 Document Revised: 07/29/2015 Document Reviewed: 03/27/2014 Elsevier Interactive Patient Education  2017 [employer].

## 2017-06-14 NOTE — Progress Notes (Addendum)
Subjective:   Jill Gutierrez is a 84 y.o. female who presents for an Initial Medicare Annual Wellness Visit.       Objective:    Today's Vitals   06/14/17 1330  BP: (!) 122/51  Pulse: 64  Temp: 98 F (36.7 C)  TempSrc: Oral  SpO2: 96%  Height: 5' (1.524 m)   Body mass index is 28.9 kg/m.  Advanced Directives 06/14/2017 11/13/2016 03/25/2015 03/19/2015 10/29/2014 10/29/2014 04/23/2014  Does Patient Have a Medical Advance Directive? No No No No No No No  Would patient like information on creating a medical advance directive? No - Patient declined - Yes - Educational materials given No - patient declined information No - patient declined information Yes - Scientist, clinical (histocompatibility and immunogenetics) given Yes - Educational materials given    Current Medications (verified) Outpatient Encounter Medications as of 06/14/2017  Medication Sig  . acetaminophen (TYLENOL) 325 MG tablet Take 2 tablets (650 mg total) by mouth every 8 (eight) hours as needed.  Marland Kitchen alum & mag hydroxide-simeth (MINTOX) 096-045-40 MG/5ML suspension Take 30 mLs by mouth every 4 (four) hours as needed for indigestion or heartburn.   . diclofenac sodium (VOLTAREN) 1 % GEL Apply 2 g topically 4 (four) times daily.  . Glucosamine-Chondroitin 1500-1200 MG/30ML LIQD Take 30 mLs by mouth daily.   Marland Kitchen latanoprost (XALATAN) 0.005 % ophthalmic solution Place 1 drop into both eyes daily. Place 1 drop into both eyes daily for glaucoma.  Marland Kitchen levothyroxine (SYNTHROID, LEVOTHROID) 175 MCG tablet Take 1 tablet (175 mcg total) by mouth daily before breakfast.  . magnesium hydroxide (MILK OF MAGNESIA) 400 MG/5ML suspension Take 30 mLs by mouth at bedtime as needed for mild constipation.  . metoprolol succinate (TOPROL-XL) 25 MG 24 hr tablet Take 25 mg by mouth daily.  . Multiple Vitamin (MULTIVITAMIN WITH MINERALS) TABS tablet Take 1 tablet by mouth daily.  Marland Kitchen neomycin-bacitracin-polymyxin (NEOSPORIN) OINT Apply 1 application topically as needed for wound care (for  skin tears or abrasions).  Loma [place] (OYSTER CALCIUM) 500 MG TABS tablet Take 500 mg of elemental calcium by mouth 2 (two) times daily.  . polyethylene glycol (MIRALAX / GLYCOLAX) packet Take 17 g by mouth daily.  . traMADol (ULTRAM) 50 MG tablet Take 1 tablet (50 mg total) by mouth every 8 (eight) hours as needed.  . traMADol (ULTRAM) 50 MG tablet ultram to 25 mg by mouth twice daily for 7 days then decrease to 25 mg daily for 7 days and then DC   No facility-administered encounter medications on file as of 06/14/2017.     Allergies (verified) Patient has no known allergies.   History: Past Medical History:  Diagnosis Date  . Alzheimer's disease   . Benign neoplasm of colon   . Cerebrovascular disease   . Dementia   . Hemorrhoids   . Hypertension   . Loss of weight   . Malignant neoplasm of thyroid gland ([place])   . Malignant neoplasm of thyroid gland ([place])   . Memory loss   . Osteoporosis, unspecified   . Protein calorie malnutrition ([place])   . Reflux esophagitis   . Routine general medical examination at a health care facility   . Screening for lipoid disorders   . Thyroid disease   . Unspecified cataract   . Unspecified constipation   . Unspecified essential hypertension   . Unspecified glaucoma(365.9)   . Unspecified glaucoma(365.9)   . Unspecified hypothyroidism 06/03/2012  . Urge incontinence    Past Surgical History:  Procedure Laterality Date  . EYE SURGERY    . hemorrhoidectomy    . NO PAST SURGERIES    . THYROIDECTOMY     Family History  Problem Relation Age of Onset  . Diabetes Father    Social History   Socioeconomic History  . Marital status: Widowed    Spouse name: Not on file  . Number of children: Not on file  . Years of education: Not on file  . Highest education level: Not on file  Occupational History  . Not on file  Social Needs  . Financial resource strain: Not hard at all  . Food insecurity:    Worry: Never true    Inability:  Never true  . Transportation needs:    Medical: No    Non-medical: No  Tobacco Use  . Smoking status: Never Smoker  . Smokeless tobacco: Never Used  Substance and Sexual Activity  . Alcohol use: No  . Drug use: No  . Sexual activity: Never  Lifestyle  . Physical activity:    Days per week: 0 days    Minutes per session: 0 min  . Stress: Patient refused  Relationships  . Social connections:    Talks on phone: More than three times a week    Gets together: More than three times a week    Attends religious service: Never    Active member of club or organization: No    Attends meetings of clubs or organizations: Never    Relationship status: Widowed  Other Topics Concern  . Not on file  Social History Narrative  . Not on file    Tobacco Counseling Counseling given: Not Answered   Clinical Intake:  Pre-visit preparation completed: No  Pain : No/denies pain     Nutritional Risks: None Diabetes: No  What is the last grade level you completed in school?: some college  Interpreter Needed?: No  Information entered by :: [name], RN   Activities of Daily Living In your present state of health, do you have any difficulty performing the following activities: 06/14/2017  Hearing? Y  Vision? Y  Difficulty concentrating or making decisions? Y  Walking or climbing stairs? Y  Dressing or bathing? Y  Doing errands, shopping? Y  Preparing Food and eating ? Y  Using the Toilet? Y  In the past six months, have you accidently leaked urine? Y  Do you have problems with loss of bowel control? Y  Managing your Medications? Y  Managing your Finances? Y  Housekeeping or managing your Housekeeping? Y  Some recent data might be hidden     Immunizations and Health Maintenance Immunization History  Administered Date(s) Administered  . Influenza, High Dose Seasonal PF 03/06/2017  . PPD Test 05/10/2015  . Pneumococcal Conjugate-13 06/14/2017  . Tdap 11/13/2016    There are no preventive care reminders to display for this patient.  Patient Care Team: Lauree Chandler, NP as PCP - General (Geriatric Medicine)  Indicate any recent Medical Services you may have received from other than [facility] providers in the past year (date may be approximate).     Assessment:   This is a routine wellness examination for Rachna.  Hearing/Vision screen Hearing Screening Comments: Patient had referral for hearing aids but she wouldn't wear them Vision Screening Comments: Pt went to eye doctor 2 years ago   Dietary issues and exercise activities discussed: Current Exercise Habits: The patient does not participate in regular exercise at present, Exercise limited by:  neurologic condition(s)  Goals    None     Depression Screen PHQ 2/9 Scores 06/14/2017 05/10/2015 11/20/2013 09/09/2012 09/09/2012 08/09/2012 06/03/2012  PHQ - 2 Score - 0 0 0 0 0 0  Exception Documentation Medical reason - - - - - -    Fall Risk Fall Risk  06/14/2017 05/10/2015 03/25/2015 10/29/2014 04/23/2014  Falls in the past year? Yes Yes Yes No No  Number falls in past yr: 2 or more 1 1 - -  Injury with Fall? Yes No Yes - -  Comment neck - - - -    Is the patient's home free of loose throw rugs in walkways, pet beds, electrical cords, etc?   yes      Grab bars in the bathroom? yes      Handrails on the stairs?   yes      Adequate lighting?   yes  Timed Get Up and Go Performed unable to perform-wheelchair bound  Cognitive Function: MMSE - Mini Mental State Exam 06/14/2017  Not completed: Unable to complete        Screening Tests Health Maintenance  Topic Date Due  . INFLUENZA VACCINE  10/04/2017  . PNA vac Low Risk Adult (2 of 2 - PPSV23) 06/15/2018  . TETANUS/TDAP  11/14/2026  . DEXA SCAN  Completed    Qualifies for Shingles Vaccine? Yes, educated and declined  Cancer Screenings: Lung: Low Dose CT Chest recommended if Age 32-80 years, 30 pack-year currently smoking OR have quit w/in  15years. Patient does not qualify. Breast: Up to date on Mammogram? Yes   Up to date of Bone Density/Dexa? Yes Colorectal: up to date  Additional Screenings:  Hepatitis C Screening: declined     Plan:  I have personally reviewed and addressed the Medicare Annual Wellness questionnaire and have noted the following in the patient's chart:  A. Medical and social history B. Use of alcohol, tobacco or illicit drugs  C. Current medications and supplements D. Functional ability and status E.  Nutritional status F.  Physical activity G. Advance directives H. List of other physicians I.  Hospitalizations, surgeries, and ER visits in previous 12 months J.  [place] to include hearing, vision, cognitive, depression L. Referrals and appointments - none  In addition, I have reviewed and discussed with patient certain preventive protocols, quality metrics, and best practice recommendations. A written personalized care plan for preventive services as well as general preventive health recommendations were provided to patient.  See attached scanned questionnaire for additional information.   Signed,   [name], RN Nurse Health Advisor  Patient concerns: None

## 2017-06-14 NOTE — Progress Notes (Signed)
Careteam: Patient Care Team: Lauree Chandler, NP as PCP - General (Geriatric Medicine)  Advanced Directive information    No Known Allergies  Chief Complaint  Patient presents with  . Medical Management of Chronic Issues    Pt is being seen for a 3 month routine visit.      HPI: Patient is a 84 y.o. female seen in the office today for routine follow up.  AWV done today Pt living at [facility] assisted living  and currently under hospice services.  Daughters with her today. Getting ready to move to [facility]- skilled facility.  Hospice has already done all paperwork per daughters.   OA- pain managed with Voltaren gel tylenol and tramadol PRN- does not use tramadol routinely.   Hypothyroid- TSH low therefore synthroid was reduced from 200 to 175 mcg, will need TSH followed up today  Constipation- taking mirlax 17 daily.   HTN- taking metoprolol 25 mg daily   Dementia- needs assistance at time for meals. lethargic today but most days more alert. Needing help dressing and bathing. incontinence of bowel and bladder- will notify if she has to have a BM at times. Overall has been stable per daughters, decline since last year.    Does not wish to discuss advanced directive or DNR at this time.  Would like her to remain a full code. Will decide this when they need to.  AWV done today   Review of Systems:  Review of Systems  Unable to perform ROS: Dementia    Past Medical History:  Diagnosis Date  . Alzheimer's disease   . Benign neoplasm of colon   . Cerebrovascular disease   . Dementia   . Hemorrhoids   . Hypertension   . Loss of weight   . Malignant neoplasm of thyroid gland ([place])   . Malignant neoplasm of thyroid gland ([place])   . Memory loss   . Osteoporosis, unspecified   . Protein calorie malnutrition ([place])   . Reflux esophagitis   . Routine general medical examination at a health care facility   . Screening for lipoid disorders   . Thyroid disease    . Unspecified cataract   . Unspecified constipation   . Unspecified essential hypertension   . Unspecified glaucoma(365.9)   . Unspecified glaucoma(365.9)   . Unspecified hypothyroidism 06/03/2012  . Urge incontinence    Past Surgical History:  Procedure Laterality Date  . EYE SURGERY    . hemorrhoidectomy    . NO PAST SURGERIES    . THYROIDECTOMY     Social History:   reports that she has never smoked. She has never used smokeless tobacco. She reports that she does not drink alcohol or use drugs.  Family History  Problem Relation Age of Onset  . Diabetes Father     Medications: Patient's Medications  New Prescriptions   No medications on file  Previous Medications   ACETAMINOPHEN (TYLENOL) 325 MG TABLET    Take 2 tablets (650 mg total) by mouth every 8 (eight) hours as needed.   ALUM & MAG HYDROXIDE-SIMETH (MINTOX) 263-785-88 MG/5ML SUSPENSION    Take 30 mLs by mouth every 4 (four) hours as needed for indigestion or heartburn.    DICLOFENAC SODIUM (VOLTAREN) 1 % GEL    Apply 2 g topically 4 (four) times daily.   GLUCOSAMINE-CHONDROITIN 1500-1200 MG/30ML LIQD    Take 30 mLs by mouth daily.    LATANOPROST (XALATAN) 0.005 % OPHTHALMIC SOLUTION    Place  1 drop into both eyes daily. Place 1 drop into both eyes daily for glaucoma.   LEVOTHYROXINE (SYNTHROID, LEVOTHROID) 175 MCG TABLET    Take 1 tablet (175 mcg total) by mouth daily before breakfast.   MAGNESIUM HYDROXIDE (MILK OF MAGNESIA) 400 MG/5ML SUSPENSION    Take 30 mLs by mouth at bedtime as needed for mild constipation.   METOPROLOL SUCCINATE (TOPROL-XL) 25 MG 24 HR TABLET    Take 25 mg by mouth daily.   MULTIPLE VITAMIN (MULTIVITAMIN WITH MINERALS) TABS TABLET    Take 1 tablet by mouth daily.   NEOMYCIN-BACITRACIN-POLYMYXIN (NEOSPORIN) OINT    Apply 1 application topically as needed for wound care (for skin tears or abrasions).   OYSTER SHELL (OYSTER CALCIUM) 500 MG TABS TABLET    Take 500 mg of elemental calcium by mouth  2 (two) times daily.   POLYETHYLENE GLYCOL (MIRALAX / GLYCOLAX) PACKET    Take 17 g by mouth daily.   TRAMADOL (ULTRAM) 50 MG TABLET    Take 1 tablet (50 mg total) by mouth every 8 (eight) hours as needed.   TRAMADOL (ULTRAM) 50 MG TABLET    ultram to 25 mg by mouth twice daily for 7 days then decrease to 25 mg daily for 7 days and then DC  Modified Medications   No medications on file  Discontinued Medications   No medications on file     Physical Exam:  Vitals:   06/14/17 1337  BP: (!) 122/51  Pulse: 64  Temp: 98 F (36.7 C)  TempSrc: Oral  SpO2: 96%   There is no height or weight on file to calculate BMI.  Physical Exam  Constitutional: No distress.  Frail elderly female in [place], minimal verbal   HENT:  Head: Normocephalic and atraumatic.  Eyes: Pupils are equal, round, and reactive to light. Conjunctivae are normal.  Neck: Normal range of motion.  Cardiovascular: Normal rate, regular rhythm and normal heart sounds.  Pulmonary/Chest: Effort normal and breath sounds normal.  Abdominal: Soft. Bowel sounds are normal.  Musculoskeletal: She exhibits no edema or tenderness.  Neurological: She is alert.  Skin: Skin is warm and dry. She is not diaphoretic.  Psychiatric: She has a normal mood and affect. Cognition and memory are impaired. She exhibits abnormal recent memory.    Labs reviewed: Basic Metabolic Panel: Recent Labs    02/20/17 1554 04/18/17 1528  TSH 0.05* 0.06*   Liver Function Tests: No results for input(s): AST, ALT, ALKPHOS, BILITOT, PROT, ALBUMIN in the last 8760 hours. No results for input(s): LIPASE, AMYLASE in the last 8760 hours. No results for input(s): AMMONIA in the last 8760 hours. CBC: No results for input(s): WBC, NEUTROABS, HGB, HCT, MCV, PLT in the last 8760 hours. Lipid Panel: No results for input(s): CHOL, HDL, LDLCALC, TRIG, CHOLHDL, LDLDIRECT in the last 8760 hours. TSH: Recent Labs    02/20/17 1554 04/18/17 1528  TSH 0.05* 0.06*     A1C: No results found for: HGBA1C   Assessment/Plan 1. Hypothyroidism, adult -TSH low on last check, will follow up at this time and make adjustments as neccessary   2. Vascular dementia without behavioral disturbance Progressive declines, currently under hospice services. Attempted to discuss DNR with family but they feel strongly about remaining a full code and making the decision based on what is going on at the time.   3. Essential hypertension, benign -blood pressure stable on current regimen - COMPLETE METABOLIC PANEL WITH GFR - CBC with Differential/Platelets  4.  Senile osteoporosis -continues on calcium, nonambulatory  5. Arthritis -continues on Voltaren gel with tylenol and tramadol PRN   Next appt: pt is planning on moving to skill facility, discussed that the providers at that facility would see her if she does move, otherwise can follow up in office in 4 months.  Carlos American. [place], [facility] [phone]

## 2017-06-15 LAB — COMPLETE METABOLIC PANEL WITH GFR
AG Ratio: 1.5 (calc) (ref 1.0–2.5)
ALT: 10 U/L (ref 6–29)
AST: 17 U/L (ref 10–35)
Albumin: 3.5 g/dL — ABNORMAL LOW (ref 3.6–5.1)
Alkaline phosphatase (APISO): 100 U/L (ref 33–130)
BUN/Creatinine Ratio: 13 (calc) (ref 6–22)
BUN: 15 mg/dL (ref 7–25)
CALCIUM: 9.1 mg/dL (ref 8.6–10.4)
CO2: 32 mmol/L (ref 20–32)
Chloride: 108 mmol/L (ref 98–110)
Creat: 1.15 mg/dL — ABNORMAL HIGH (ref 0.60–0.88)
GFR, EST AFRICAN AMERICAN: 46 mL/min/{1.73_m2} — AB (ref >=60)
GFR, EST NON AFRICAN AMERICAN: 40 mL/min/{1.73_m2} — AB (ref >=60)
GLOBULIN: 2.3 g/dL (ref 1.9–3.7)
Glucose, Bld: 124 mg/dL (ref 65–139)
POTASSIUM: 4 mmol/L (ref 3.5–5.3)
SODIUM: 145 mmol/L (ref 135–146)
TOTAL PROTEIN: 5.8 g/dL — AB (ref 6.1–8.1)
Total Bilirubin: 0.2 mg/dL (ref 0.2–1.2)

## 2017-06-15 LAB — CBC WITH DIFFERENTIAL/PLATELET
BASOS ABS: 10 {cells}/uL (ref 0–200)
Basophils Relative: 0.3 %
Eosinophils Absolute: 99 cells/uL (ref 15–500)
Eosinophils Relative: 3 %
HCT: 37.4 % (ref 35.0–45.0)
Hemoglobin: 12.2 g/dL (ref 11.7–15.5)
Lymphs Abs: 828 cells/uL — ABNORMAL LOW (ref 850–3900)
MCH: 28.8 pg (ref 27.0–33.0)
MCHC: 32.6 g/dL (ref 32.0–36.0)
MCV: 88.4 fL (ref 80.0–100.0)
MONOS PCT: 10.9 %
MPV: 11.2 fL (ref 7.5–12.5)
Neutro Abs: 2003 cells/uL (ref 1500–7800)
Neutrophils Relative %: 60.7 %
PLATELETS: 161 10*3/uL (ref 140–400)
RBC: 4.23 10*6/uL (ref 3.80–5.10)
RDW: 12.3 % (ref 11.0–15.0)
TOTAL LYMPHOCYTE: 25.1 %
WBC mixed population: 360 cells/uL (ref 200–950)
WBC: 3.3 10*3/uL — ABNORMAL LOW (ref 3.8–10.8)

## 2017-06-15 LAB — TEST AUTHORIZATION

## 2017-06-15 LAB — TSH: TSH: 8.49 m[IU]/L — AB (ref 0.40–4.50)

## 2017-06-19 ENCOUNTER — Telehealth: Payer: Self-pay

## 2017-06-19 DIAGNOSIS — I672 Cerebral atherosclerosis: Secondary | ICD-10-CM | POA: Diagnosis not present

## 2017-06-19 DIAGNOSIS — N183 Chronic kidney disease, stage 3 (moderate): Secondary | ICD-10-CM | POA: Diagnosis not present

## 2017-06-19 DIAGNOSIS — R1310 Dysphagia, unspecified: Secondary | ICD-10-CM | POA: Diagnosis not present

## 2017-06-19 DIAGNOSIS — F0150 Vascular dementia without behavioral disturbance: Secondary | ICD-10-CM | POA: Diagnosis not present

## 2017-06-19 DIAGNOSIS — I10 Essential (primary) hypertension: Secondary | ICD-10-CM | POA: Diagnosis not present

## 2017-06-19 DIAGNOSIS — M2540 Effusion, unspecified joint: Secondary | ICD-10-CM | POA: Diagnosis not present

## 2017-06-19 NOTE — Telephone Encounter (Signed)
Medication list has been updated to reflect changes made by provider based on lab results. A written prescription was given to patient's daughter.

## 2017-06-19 NOTE — Telephone Encounter (Signed)
-----   Message from Lauree Chandler, NP sent at 06/19/2017  9:52 AM EDT ----- Lets increase synthroid to 188 mcg (100 mcg tablet and 88 mcg tablet taken together) by mouth daily since TSH now is too high and recheck TSH in 8 week. Please notify facility and daughter of changes to medication

## 2017-06-20 DIAGNOSIS — R1310 Dysphagia, unspecified: Secondary | ICD-10-CM | POA: Diagnosis not present

## 2017-06-20 DIAGNOSIS — M2540 Effusion, unspecified joint: Secondary | ICD-10-CM | POA: Diagnosis not present

## 2017-06-20 DIAGNOSIS — I672 Cerebral atherosclerosis: Secondary | ICD-10-CM | POA: Diagnosis not present

## 2017-06-20 DIAGNOSIS — N183 Chronic kidney disease, stage 3 (moderate): Secondary | ICD-10-CM | POA: Diagnosis not present

## 2017-06-20 DIAGNOSIS — I10 Essential (primary) hypertension: Secondary | ICD-10-CM | POA: Diagnosis not present

## 2017-06-20 DIAGNOSIS — F0150 Vascular dementia without behavioral disturbance: Secondary | ICD-10-CM | POA: Diagnosis not present

## 2017-06-26 DIAGNOSIS — M2540 Effusion, unspecified joint: Secondary | ICD-10-CM | POA: Diagnosis not present

## 2017-06-26 DIAGNOSIS — R1310 Dysphagia, unspecified: Secondary | ICD-10-CM | POA: Diagnosis not present

## 2017-06-26 DIAGNOSIS — F0150 Vascular dementia without behavioral disturbance: Secondary | ICD-10-CM | POA: Diagnosis not present

## 2017-06-26 DIAGNOSIS — I672 Cerebral atherosclerosis: Secondary | ICD-10-CM | POA: Diagnosis not present

## 2017-06-26 DIAGNOSIS — I10 Essential (primary) hypertension: Secondary | ICD-10-CM | POA: Diagnosis not present

## 2017-06-26 DIAGNOSIS — N183 Chronic kidney disease, stage 3 (moderate): Secondary | ICD-10-CM | POA: Diagnosis not present

## 2017-06-27 DIAGNOSIS — I672 Cerebral atherosclerosis: Secondary | ICD-10-CM | POA: Diagnosis not present

## 2017-06-27 DIAGNOSIS — N183 Chronic kidney disease, stage 3 (moderate): Secondary | ICD-10-CM | POA: Diagnosis not present

## 2017-06-27 DIAGNOSIS — R1310 Dysphagia, unspecified: Secondary | ICD-10-CM | POA: Diagnosis not present

## 2017-06-27 DIAGNOSIS — M2540 Effusion, unspecified joint: Secondary | ICD-10-CM | POA: Diagnosis not present

## 2017-06-27 DIAGNOSIS — F0150 Vascular dementia without behavioral disturbance: Secondary | ICD-10-CM | POA: Diagnosis not present

## 2017-06-27 DIAGNOSIS — I10 Essential (primary) hypertension: Secondary | ICD-10-CM | POA: Diagnosis not present

## 2017-06-28 DIAGNOSIS — I10 Essential (primary) hypertension: Secondary | ICD-10-CM | POA: Diagnosis not present

## 2017-06-28 DIAGNOSIS — I672 Cerebral atherosclerosis: Secondary | ICD-10-CM | POA: Diagnosis not present

## 2017-06-28 DIAGNOSIS — F0150 Vascular dementia without behavioral disturbance: Secondary | ICD-10-CM | POA: Diagnosis not present

## 2017-06-28 DIAGNOSIS — N183 Chronic kidney disease, stage 3 (moderate): Secondary | ICD-10-CM | POA: Diagnosis not present

## 2017-06-28 DIAGNOSIS — M2540 Effusion, unspecified joint: Secondary | ICD-10-CM | POA: Diagnosis not present

## 2017-06-28 DIAGNOSIS — R1310 Dysphagia, unspecified: Secondary | ICD-10-CM | POA: Diagnosis not present

## 2017-07-02 DIAGNOSIS — I672 Cerebral atherosclerosis: Secondary | ICD-10-CM | POA: Diagnosis not present

## 2017-07-02 DIAGNOSIS — F0150 Vascular dementia without behavioral disturbance: Secondary | ICD-10-CM | POA: Diagnosis not present

## 2017-07-02 DIAGNOSIS — M2540 Effusion, unspecified joint: Secondary | ICD-10-CM | POA: Diagnosis not present

## 2017-07-02 DIAGNOSIS — N183 Chronic kidney disease, stage 3 (moderate): Secondary | ICD-10-CM | POA: Diagnosis not present

## 2017-07-02 DIAGNOSIS — R1310 Dysphagia, unspecified: Secondary | ICD-10-CM | POA: Diagnosis not present

## 2017-07-02 DIAGNOSIS — I10 Essential (primary) hypertension: Secondary | ICD-10-CM | POA: Diagnosis not present

## 2017-07-03 DIAGNOSIS — N183 Chronic kidney disease, stage 3 (moderate): Secondary | ICD-10-CM | POA: Diagnosis not present

## 2017-07-03 DIAGNOSIS — I10 Essential (primary) hypertension: Secondary | ICD-10-CM | POA: Diagnosis not present

## 2017-07-03 DIAGNOSIS — M2540 Effusion, unspecified joint: Secondary | ICD-10-CM | POA: Diagnosis not present

## 2017-07-03 DIAGNOSIS — F0150 Vascular dementia without behavioral disturbance: Secondary | ICD-10-CM | POA: Diagnosis not present

## 2017-07-03 DIAGNOSIS — I672 Cerebral atherosclerosis: Secondary | ICD-10-CM | POA: Diagnosis not present

## 2017-07-03 DIAGNOSIS — R1310 Dysphagia, unspecified: Secondary | ICD-10-CM | POA: Diagnosis not present

## 2017-07-04 DIAGNOSIS — I10 Essential (primary) hypertension: Secondary | ICD-10-CM | POA: Diagnosis not present

## 2017-07-04 DIAGNOSIS — F0150 Vascular dementia without behavioral disturbance: Secondary | ICD-10-CM | POA: Diagnosis not present

## 2017-07-04 DIAGNOSIS — E039 Hypothyroidism, unspecified: Secondary | ICD-10-CM | POA: Diagnosis not present

## 2017-07-04 DIAGNOSIS — H409 Unspecified glaucoma: Secondary | ICD-10-CM | POA: Diagnosis not present

## 2017-07-04 DIAGNOSIS — N183 Chronic kidney disease, stage 3 (moderate): Secondary | ICD-10-CM | POA: Diagnosis not present

## 2017-07-04 DIAGNOSIS — R54 Age-related physical debility: Secondary | ICD-10-CM | POA: Diagnosis not present

## 2017-07-04 DIAGNOSIS — R1310 Dysphagia, unspecified: Secondary | ICD-10-CM | POA: Diagnosis not present

## 2017-07-04 DIAGNOSIS — M2540 Effusion, unspecified joint: Secondary | ICD-10-CM | POA: Diagnosis not present

## 2017-07-04 DIAGNOSIS — I672 Cerebral atherosclerosis: Secondary | ICD-10-CM | POA: Diagnosis not present

## 2017-07-05 DIAGNOSIS — N183 Chronic kidney disease, stage 3 (moderate): Secondary | ICD-10-CM | POA: Diagnosis not present

## 2017-07-05 DIAGNOSIS — I10 Essential (primary) hypertension: Secondary | ICD-10-CM | POA: Diagnosis not present

## 2017-07-05 DIAGNOSIS — R1310 Dysphagia, unspecified: Secondary | ICD-10-CM | POA: Diagnosis not present

## 2017-07-05 DIAGNOSIS — M6281 Muscle weakness (generalized): Secondary | ICD-10-CM | POA: Diagnosis not present

## 2017-07-05 DIAGNOSIS — G8929 Other chronic pain: Secondary | ICD-10-CM | POA: Diagnosis not present

## 2017-07-05 DIAGNOSIS — F0150 Vascular dementia without behavioral disturbance: Secondary | ICD-10-CM | POA: Diagnosis not present

## 2017-07-05 DIAGNOSIS — I672 Cerebral atherosclerosis: Secondary | ICD-10-CM | POA: Diagnosis not present

## 2017-07-05 DIAGNOSIS — R54 Age-related physical debility: Secondary | ICD-10-CM | POA: Diagnosis not present

## 2017-07-06 DIAGNOSIS — N183 Chronic kidney disease, stage 3 (moderate): Secondary | ICD-10-CM | POA: Diagnosis not present

## 2017-07-06 DIAGNOSIS — R1310 Dysphagia, unspecified: Secondary | ICD-10-CM | POA: Diagnosis not present

## 2017-07-06 DIAGNOSIS — R54 Age-related physical debility: Secondary | ICD-10-CM | POA: Diagnosis not present

## 2017-07-06 DIAGNOSIS — E039 Hypothyroidism, unspecified: Secondary | ICD-10-CM | POA: Diagnosis not present

## 2017-07-06 DIAGNOSIS — F0150 Vascular dementia without behavioral disturbance: Secondary | ICD-10-CM | POA: Diagnosis not present

## 2017-07-06 DIAGNOSIS — G8929 Other chronic pain: Secondary | ICD-10-CM | POA: Diagnosis not present

## 2017-07-06 DIAGNOSIS — M6281 Muscle weakness (generalized): Secondary | ICD-10-CM | POA: Diagnosis not present

## 2017-07-06 DIAGNOSIS — I10 Essential (primary) hypertension: Secondary | ICD-10-CM | POA: Diagnosis not present

## 2017-07-06 DIAGNOSIS — I672 Cerebral atherosclerosis: Secondary | ICD-10-CM | POA: Diagnosis not present

## 2017-07-10 DIAGNOSIS — F0150 Vascular dementia without behavioral disturbance: Secondary | ICD-10-CM | POA: Diagnosis not present

## 2017-07-10 DIAGNOSIS — E039 Hypothyroidism, unspecified: Secondary | ICD-10-CM | POA: Diagnosis not present

## 2017-07-10 DIAGNOSIS — R1310 Dysphagia, unspecified: Secondary | ICD-10-CM | POA: Diagnosis not present

## 2017-07-10 DIAGNOSIS — I10 Essential (primary) hypertension: Secondary | ICD-10-CM | POA: Diagnosis not present

## 2017-07-12 DIAGNOSIS — I10 Essential (primary) hypertension: Secondary | ICD-10-CM | POA: Diagnosis not present

## 2017-07-12 DIAGNOSIS — N183 Chronic kidney disease, stage 3 (moderate): Secondary | ICD-10-CM | POA: Diagnosis not present

## 2017-07-12 DIAGNOSIS — E039 Hypothyroidism, unspecified: Secondary | ICD-10-CM | POA: Diagnosis not present

## 2017-07-12 DIAGNOSIS — R54 Age-related physical debility: Secondary | ICD-10-CM | POA: Diagnosis not present

## 2017-07-15 DIAGNOSIS — F0150 Vascular dementia without behavioral disturbance: Secondary | ICD-10-CM | POA: Diagnosis not present

## 2017-07-15 DIAGNOSIS — R1310 Dysphagia, unspecified: Secondary | ICD-10-CM | POA: Diagnosis not present

## 2017-07-15 DIAGNOSIS — R54 Age-related physical debility: Secondary | ICD-10-CM | POA: Diagnosis not present

## 2017-07-15 DIAGNOSIS — N183 Chronic kidney disease, stage 3 (moderate): Secondary | ICD-10-CM | POA: Diagnosis not present

## 2017-07-15 DIAGNOSIS — I672 Cerebral atherosclerosis: Secondary | ICD-10-CM | POA: Diagnosis not present

## 2017-07-15 DIAGNOSIS — M6281 Muscle weakness (generalized): Secondary | ICD-10-CM | POA: Diagnosis not present

## 2017-07-15 DIAGNOSIS — I10 Essential (primary) hypertension: Secondary | ICD-10-CM | POA: Diagnosis not present

## 2017-07-16 DIAGNOSIS — I10 Essential (primary) hypertension: Secondary | ICD-10-CM | POA: Diagnosis not present

## 2017-07-16 DIAGNOSIS — R54 Age-related physical debility: Secondary | ICD-10-CM | POA: Diagnosis not present

## 2017-07-16 DIAGNOSIS — I672 Cerebral atherosclerosis: Secondary | ICD-10-CM | POA: Diagnosis not present

## 2017-07-16 DIAGNOSIS — F0150 Vascular dementia without behavioral disturbance: Secondary | ICD-10-CM | POA: Diagnosis not present

## 2017-07-16 DIAGNOSIS — R1310 Dysphagia, unspecified: Secondary | ICD-10-CM | POA: Diagnosis not present

## 2017-07-16 DIAGNOSIS — N183 Chronic kidney disease, stage 3 (moderate): Secondary | ICD-10-CM | POA: Diagnosis not present

## 2017-07-17 DIAGNOSIS — F0150 Vascular dementia without behavioral disturbance: Secondary | ICD-10-CM | POA: Diagnosis not present

## 2017-07-17 DIAGNOSIS — I672 Cerebral atherosclerosis: Secondary | ICD-10-CM | POA: Diagnosis not present

## 2017-07-17 DIAGNOSIS — R54 Age-related physical debility: Secondary | ICD-10-CM | POA: Diagnosis not present

## 2017-07-17 DIAGNOSIS — B351 Tinea unguium: Secondary | ICD-10-CM | POA: Diagnosis not present

## 2017-07-17 DIAGNOSIS — N183 Chronic kidney disease, stage 3 (moderate): Secondary | ICD-10-CM | POA: Diagnosis not present

## 2017-07-17 DIAGNOSIS — M2012 Hallux valgus (acquired), left foot: Secondary | ICD-10-CM | POA: Diagnosis not present

## 2017-07-17 DIAGNOSIS — R1310 Dysphagia, unspecified: Secondary | ICD-10-CM | POA: Diagnosis not present

## 2017-07-17 DIAGNOSIS — I10 Essential (primary) hypertension: Secondary | ICD-10-CM | POA: Diagnosis not present

## 2017-07-18 DIAGNOSIS — R1310 Dysphagia, unspecified: Secondary | ICD-10-CM | POA: Diagnosis not present

## 2017-07-18 DIAGNOSIS — R54 Age-related physical debility: Secondary | ICD-10-CM | POA: Diagnosis not present

## 2017-07-18 DIAGNOSIS — I10 Essential (primary) hypertension: Secondary | ICD-10-CM | POA: Diagnosis not present

## 2017-07-18 DIAGNOSIS — N183 Chronic kidney disease, stage 3 (moderate): Secondary | ICD-10-CM | POA: Diagnosis not present

## 2017-07-18 DIAGNOSIS — I672 Cerebral atherosclerosis: Secondary | ICD-10-CM | POA: Diagnosis not present

## 2017-07-18 DIAGNOSIS — F0150 Vascular dementia without behavioral disturbance: Secondary | ICD-10-CM | POA: Diagnosis not present

## 2017-07-19 DIAGNOSIS — R54 Age-related physical debility: Secondary | ICD-10-CM | POA: Diagnosis not present

## 2017-07-19 DIAGNOSIS — R1310 Dysphagia, unspecified: Secondary | ICD-10-CM | POA: Diagnosis not present

## 2017-07-19 DIAGNOSIS — N183 Chronic kidney disease, stage 3 (moderate): Secondary | ICD-10-CM | POA: Diagnosis not present

## 2017-07-19 DIAGNOSIS — F0150 Vascular dementia without behavioral disturbance: Secondary | ICD-10-CM | POA: Diagnosis not present

## 2017-07-19 DIAGNOSIS — G8929 Other chronic pain: Secondary | ICD-10-CM | POA: Diagnosis not present

## 2017-07-19 DIAGNOSIS — I10 Essential (primary) hypertension: Secondary | ICD-10-CM | POA: Diagnosis not present

## 2017-07-19 DIAGNOSIS — I672 Cerebral atherosclerosis: Secondary | ICD-10-CM | POA: Diagnosis not present

## 2017-07-20 DIAGNOSIS — I10 Essential (primary) hypertension: Secondary | ICD-10-CM | POA: Diagnosis not present

## 2017-07-20 DIAGNOSIS — R54 Age-related physical debility: Secondary | ICD-10-CM | POA: Diagnosis not present

## 2017-07-20 DIAGNOSIS — F0150 Vascular dementia without behavioral disturbance: Secondary | ICD-10-CM | POA: Diagnosis not present

## 2017-07-20 DIAGNOSIS — N183 Chronic kidney disease, stage 3 (moderate): Secondary | ICD-10-CM | POA: Diagnosis not present

## 2017-07-20 DIAGNOSIS — I672 Cerebral atherosclerosis: Secondary | ICD-10-CM | POA: Diagnosis not present

## 2017-07-20 DIAGNOSIS — R1310 Dysphagia, unspecified: Secondary | ICD-10-CM | POA: Diagnosis not present

## 2017-07-23 DIAGNOSIS — R54 Age-related physical debility: Secondary | ICD-10-CM | POA: Diagnosis not present

## 2017-07-23 DIAGNOSIS — F0150 Vascular dementia without behavioral disturbance: Secondary | ICD-10-CM | POA: Diagnosis not present

## 2017-07-23 DIAGNOSIS — R1310 Dysphagia, unspecified: Secondary | ICD-10-CM | POA: Diagnosis not present

## 2017-07-23 DIAGNOSIS — N183 Chronic kidney disease, stage 3 (moderate): Secondary | ICD-10-CM | POA: Diagnosis not present

## 2017-07-23 DIAGNOSIS — I10 Essential (primary) hypertension: Secondary | ICD-10-CM | POA: Diagnosis not present

## 2017-07-23 DIAGNOSIS — I672 Cerebral atherosclerosis: Secondary | ICD-10-CM | POA: Diagnosis not present

## 2017-07-24 DIAGNOSIS — R54 Age-related physical debility: Secondary | ICD-10-CM | POA: Diagnosis not present

## 2017-07-24 DIAGNOSIS — F0150 Vascular dementia without behavioral disturbance: Secondary | ICD-10-CM | POA: Diagnosis not present

## 2017-07-24 DIAGNOSIS — I672 Cerebral atherosclerosis: Secondary | ICD-10-CM | POA: Diagnosis not present

## 2017-07-24 DIAGNOSIS — R1310 Dysphagia, unspecified: Secondary | ICD-10-CM | POA: Diagnosis not present

## 2017-07-24 DIAGNOSIS — N183 Chronic kidney disease, stage 3 (moderate): Secondary | ICD-10-CM | POA: Diagnosis not present

## 2017-07-24 DIAGNOSIS — I10 Essential (primary) hypertension: Secondary | ICD-10-CM | POA: Diagnosis not present

## 2017-07-25 DIAGNOSIS — F0150 Vascular dementia without behavioral disturbance: Secondary | ICD-10-CM | POA: Diagnosis not present

## 2017-07-25 DIAGNOSIS — R1310 Dysphagia, unspecified: Secondary | ICD-10-CM | POA: Diagnosis not present

## 2017-07-25 DIAGNOSIS — I672 Cerebral atherosclerosis: Secondary | ICD-10-CM | POA: Diagnosis not present

## 2017-07-25 DIAGNOSIS — I10 Essential (primary) hypertension: Secondary | ICD-10-CM | POA: Diagnosis not present

## 2017-07-25 DIAGNOSIS — R54 Age-related physical debility: Secondary | ICD-10-CM | POA: Diagnosis not present

## 2017-07-25 DIAGNOSIS — N183 Chronic kidney disease, stage 3 (moderate): Secondary | ICD-10-CM | POA: Diagnosis not present

## 2017-07-26 DIAGNOSIS — N183 Chronic kidney disease, stage 3 (moderate): Secondary | ICD-10-CM | POA: Diagnosis not present

## 2017-07-26 DIAGNOSIS — R1310 Dysphagia, unspecified: Secondary | ICD-10-CM | POA: Diagnosis not present

## 2017-07-26 DIAGNOSIS — I672 Cerebral atherosclerosis: Secondary | ICD-10-CM | POA: Diagnosis not present

## 2017-07-26 DIAGNOSIS — E039 Hypothyroidism, unspecified: Secondary | ICD-10-CM | POA: Diagnosis not present

## 2017-07-26 DIAGNOSIS — I10 Essential (primary) hypertension: Secondary | ICD-10-CM | POA: Diagnosis not present

## 2017-07-26 DIAGNOSIS — F0150 Vascular dementia without behavioral disturbance: Secondary | ICD-10-CM | POA: Diagnosis not present

## 2017-07-26 DIAGNOSIS — R54 Age-related physical debility: Secondary | ICD-10-CM | POA: Diagnosis not present

## 2017-07-27 DIAGNOSIS — R1310 Dysphagia, unspecified: Secondary | ICD-10-CM | POA: Diagnosis not present

## 2017-07-27 DIAGNOSIS — I672 Cerebral atherosclerosis: Secondary | ICD-10-CM | POA: Diagnosis not present

## 2017-07-27 DIAGNOSIS — R54 Age-related physical debility: Secondary | ICD-10-CM | POA: Diagnosis not present

## 2017-07-27 DIAGNOSIS — N183 Chronic kidney disease, stage 3 (moderate): Secondary | ICD-10-CM | POA: Diagnosis not present

## 2017-07-27 DIAGNOSIS — I10 Essential (primary) hypertension: Secondary | ICD-10-CM | POA: Diagnosis not present

## 2017-07-27 DIAGNOSIS — F0150 Vascular dementia without behavioral disturbance: Secondary | ICD-10-CM | POA: Diagnosis not present

## 2017-07-28 DIAGNOSIS — F0150 Vascular dementia without behavioral disturbance: Secondary | ICD-10-CM | POA: Diagnosis not present

## 2017-07-28 DIAGNOSIS — I672 Cerebral atherosclerosis: Secondary | ICD-10-CM | POA: Diagnosis not present

## 2017-07-28 DIAGNOSIS — R1310 Dysphagia, unspecified: Secondary | ICD-10-CM | POA: Diagnosis not present

## 2017-07-28 DIAGNOSIS — I10 Essential (primary) hypertension: Secondary | ICD-10-CM | POA: Diagnosis not present

## 2017-07-28 DIAGNOSIS — N183 Chronic kidney disease, stage 3 (moderate): Secondary | ICD-10-CM | POA: Diagnosis not present

## 2017-07-28 DIAGNOSIS — R54 Age-related physical debility: Secondary | ICD-10-CM | POA: Diagnosis not present

## 2017-07-30 DIAGNOSIS — R54 Age-related physical debility: Secondary | ICD-10-CM | POA: Diagnosis not present

## 2017-07-30 DIAGNOSIS — F0150 Vascular dementia without behavioral disturbance: Secondary | ICD-10-CM | POA: Diagnosis not present

## 2017-07-30 DIAGNOSIS — I10 Essential (primary) hypertension: Secondary | ICD-10-CM | POA: Diagnosis not present

## 2017-07-30 DIAGNOSIS — N183 Chronic kidney disease, stage 3 (moderate): Secondary | ICD-10-CM | POA: Diagnosis not present

## 2017-07-30 DIAGNOSIS — I672 Cerebral atherosclerosis: Secondary | ICD-10-CM | POA: Diagnosis not present

## 2017-07-30 DIAGNOSIS — R1310 Dysphagia, unspecified: Secondary | ICD-10-CM | POA: Diagnosis not present

## 2017-07-31 DIAGNOSIS — I10 Essential (primary) hypertension: Secondary | ICD-10-CM | POA: Diagnosis not present

## 2017-07-31 DIAGNOSIS — R1310 Dysphagia, unspecified: Secondary | ICD-10-CM | POA: Diagnosis not present

## 2017-07-31 DIAGNOSIS — N183 Chronic kidney disease, stage 3 (moderate): Secondary | ICD-10-CM | POA: Diagnosis not present

## 2017-07-31 DIAGNOSIS — R54 Age-related physical debility: Secondary | ICD-10-CM | POA: Diagnosis not present

## 2017-07-31 DIAGNOSIS — I672 Cerebral atherosclerosis: Secondary | ICD-10-CM | POA: Diagnosis not present

## 2017-07-31 DIAGNOSIS — F0150 Vascular dementia without behavioral disturbance: Secondary | ICD-10-CM | POA: Diagnosis not present

## 2017-08-01 DIAGNOSIS — R1310 Dysphagia, unspecified: Secondary | ICD-10-CM | POA: Diagnosis not present

## 2017-08-01 DIAGNOSIS — N183 Chronic kidney disease, stage 3 (moderate): Secondary | ICD-10-CM | POA: Diagnosis not present

## 2017-08-01 DIAGNOSIS — I10 Essential (primary) hypertension: Secondary | ICD-10-CM | POA: Diagnosis not present

## 2017-08-01 DIAGNOSIS — I672 Cerebral atherosclerosis: Secondary | ICD-10-CM | POA: Diagnosis not present

## 2017-08-01 DIAGNOSIS — F0150 Vascular dementia without behavioral disturbance: Secondary | ICD-10-CM | POA: Diagnosis not present

## 2017-08-01 DIAGNOSIS — R54 Age-related physical debility: Secondary | ICD-10-CM | POA: Diagnosis not present

## 2017-08-02 DIAGNOSIS — I10 Essential (primary) hypertension: Secondary | ICD-10-CM | POA: Diagnosis not present

## 2017-08-02 DIAGNOSIS — I672 Cerebral atherosclerosis: Secondary | ICD-10-CM | POA: Diagnosis not present

## 2017-08-02 DIAGNOSIS — F0150 Vascular dementia without behavioral disturbance: Secondary | ICD-10-CM | POA: Diagnosis not present

## 2017-08-02 DIAGNOSIS — G8929 Other chronic pain: Secondary | ICD-10-CM | POA: Diagnosis not present

## 2017-08-02 DIAGNOSIS — R1310 Dysphagia, unspecified: Secondary | ICD-10-CM | POA: Diagnosis not present

## 2017-08-02 DIAGNOSIS — R54 Age-related physical debility: Secondary | ICD-10-CM | POA: Diagnosis not present

## 2017-08-02 DIAGNOSIS — N183 Chronic kidney disease, stage 3 (moderate): Secondary | ICD-10-CM | POA: Diagnosis not present

## 2017-08-03 DIAGNOSIS — R1310 Dysphagia, unspecified: Secondary | ICD-10-CM | POA: Diagnosis not present

## 2017-08-03 DIAGNOSIS — R54 Age-related physical debility: Secondary | ICD-10-CM | POA: Diagnosis not present

## 2017-08-03 DIAGNOSIS — F0150 Vascular dementia without behavioral disturbance: Secondary | ICD-10-CM | POA: Diagnosis not present

## 2017-08-03 DIAGNOSIS — N183 Chronic kidney disease, stage 3 (moderate): Secondary | ICD-10-CM | POA: Diagnosis not present

## 2017-08-03 DIAGNOSIS — I672 Cerebral atherosclerosis: Secondary | ICD-10-CM | POA: Diagnosis not present

## 2017-08-03 DIAGNOSIS — I10 Essential (primary) hypertension: Secondary | ICD-10-CM | POA: Diagnosis not present

## 2017-08-09 DIAGNOSIS — R5383 Other fatigue: Secondary | ICD-10-CM | POA: Diagnosis not present

## 2017-08-09 DIAGNOSIS — F0150 Vascular dementia without behavioral disturbance: Secondary | ICD-10-CM | POA: Diagnosis not present

## 2017-08-13 DIAGNOSIS — I10 Essential (primary) hypertension: Secondary | ICD-10-CM | POA: Diagnosis not present

## 2017-08-13 DIAGNOSIS — N183 Chronic kidney disease, stage 3 (moderate): Secondary | ICD-10-CM | POA: Diagnosis not present

## 2017-08-13 DIAGNOSIS — I672 Cerebral atherosclerosis: Secondary | ICD-10-CM | POA: Diagnosis not present

## 2017-08-13 DIAGNOSIS — F0150 Vascular dementia without behavioral disturbance: Secondary | ICD-10-CM | POA: Diagnosis not present

## 2017-08-14 DIAGNOSIS — F0150 Vascular dementia without behavioral disturbance: Secondary | ICD-10-CM | POA: Diagnosis not present

## 2017-08-14 DIAGNOSIS — F419 Anxiety disorder, unspecified: Secondary | ICD-10-CM | POA: Diagnosis not present

## 2017-08-30 DIAGNOSIS — F419 Anxiety disorder, unspecified: Secondary | ICD-10-CM | POA: Diagnosis not present

## 2017-08-30 DIAGNOSIS — E039 Hypothyroidism, unspecified: Secondary | ICD-10-CM | POA: Diagnosis not present

## 2017-08-30 DIAGNOSIS — F0150 Vascular dementia without behavioral disturbance: Secondary | ICD-10-CM | POA: Diagnosis not present

## 2017-08-30 DIAGNOSIS — R1310 Dysphagia, unspecified: Secondary | ICD-10-CM | POA: Diagnosis not present

## 2017-09-04 DIAGNOSIS — I10 Essential (primary) hypertension: Secondary | ICD-10-CM | POA: Diagnosis not present

## 2017-09-04 DIAGNOSIS — F0150 Vascular dementia without behavioral disturbance: Secondary | ICD-10-CM | POA: Diagnosis not present

## 2017-09-04 DIAGNOSIS — G8929 Other chronic pain: Secondary | ICD-10-CM | POA: Diagnosis not present

## 2017-09-04 DIAGNOSIS — E039 Hypothyroidism, unspecified: Secondary | ICD-10-CM | POA: Diagnosis not present

## 2018-04-18 ENCOUNTER — Encounter: Payer: Self-pay | Admitting: Family

## 2018-06-17 ENCOUNTER — Ambulatory Visit: Payer: Medicare Other

## 2018-06-17 ENCOUNTER — Encounter: Payer: Medicare Other | Admitting: Family

## 2018-06-18 ENCOUNTER — Encounter: Payer: Medicare Other | Admitting: Adult Health

## 2018-06-18 ENCOUNTER — Other Ambulatory Visit: Payer: Self-pay

## 2018-06-30 ENCOUNTER — Encounter: Payer: Self-pay | Admitting: Nurse Practitioner

## 2019-02-03 ENCOUNTER — Other Ambulatory Visit: Payer: Self-pay

## 2019-02-03 ENCOUNTER — Emergency Department (HOSPITAL_COMMUNITY): Payer: Medicare Other

## 2019-02-03 ENCOUNTER — Emergency Department (HOSPITAL_COMMUNITY)
Admission: EM | Admit: 2019-02-03 | Discharge: 2019-02-04 | Disposition: A | Discharge status: Home / Self Care | Payer: Medicare Other | Attending: Emergency Medicine | Admitting: Emergency Medicine

## 2019-02-03 ENCOUNTER — Encounter (HOSPITAL_COMMUNITY): Payer: Self-pay | Admitting: Emergency Medicine

## 2019-02-03 DIAGNOSIS — Y9389 Activity, other specified: Secondary | ICD-10-CM | POA: Insufficient documentation

## 2019-02-03 DIAGNOSIS — Z85850 Personal history of malignant neoplasm of thyroid: Secondary | ICD-10-CM | POA: Insufficient documentation

## 2019-02-03 DIAGNOSIS — G309 Alzheimer's disease, unspecified: Secondary | ICD-10-CM | POA: Diagnosis not present

## 2019-02-03 DIAGNOSIS — Z79899 Other long term (current) drug therapy: Secondary | ICD-10-CM | POA: Diagnosis not present

## 2019-02-03 DIAGNOSIS — S20319A Abrasion of unspecified front wall of thorax, initial encounter: Secondary | ICD-10-CM

## 2019-02-03 DIAGNOSIS — M25561 Pain in right knee: Secondary | ICD-10-CM | POA: Diagnosis not present

## 2019-02-03 DIAGNOSIS — I10 Essential (primary) hypertension: Secondary | ICD-10-CM | POA: Diagnosis not present

## 2019-02-03 DIAGNOSIS — Y92019 Unspecified place in single-family (private) house as the place of occurrence of the external cause: Secondary | ICD-10-CM | POA: Diagnosis not present

## 2019-02-03 DIAGNOSIS — E039 Hypothyroidism, unspecified: Secondary | ICD-10-CM | POA: Diagnosis not present

## 2019-02-03 DIAGNOSIS — Y999 Unspecified external cause status: Secondary | ICD-10-CM | POA: Insufficient documentation

## 2019-02-03 DIAGNOSIS — W050XXA Fall from non-moving wheelchair, initial encounter: Secondary | ICD-10-CM | POA: Diagnosis not present

## 2019-02-03 DIAGNOSIS — M25562 Pain in left knee: Secondary | ICD-10-CM

## 2019-02-03 DIAGNOSIS — S20311A Abrasion of right front wall of thorax, initial encounter: Secondary | ICD-10-CM | POA: Insufficient documentation

## 2019-02-03 NOTE — ED Triage Notes (Signed)
Pt here from home for a fall. Pt is wheelchair bound, family was using a lift to help pt and she fell out of lift, landed on knees, and then lift fell on top of her. Pt c/o of bilateral knee pain.

## 2019-02-04 MED ORDER — ACETAMINOPHEN 500 MG PO TABS
1000.0000 mg | ORAL_TABLET | Freq: Once | ORAL | Status: AC
  Administered 2019-02-04: 1000 mg via ORAL
  Filled 2019-02-04: qty 2

## 2019-02-04 NOTE — ED Provider Notes (Signed)
TIME SEEN: 12:47 AM  CHIEF COMPLAINT: fall  HPI: Patient is a 85 year old female with history of dementia, hypertension, hypothyroidism who presents to the emergency department with her daughter after she fell forward out of her wheelchair tonight at home and landed on both knees.  Complaining of bilateral knee pain.  She does not ambulate at baseline.  Daughter states that they brought her home from [place] care today because they wanted her to be at home "since she is 85 years old."  The report when trying to transfer her from the wheelchair to the lift, patient fell forward in the left landed on her right side.  There was no head injury or loss of consciousness.  Has a small abrasion to her chest wall.  No chest pain or shortness of breath.  No neck or back pain.  Patient denies headache.  Only complaining of bilateral knee pain.  No medications given prior to arrival.  ROS: Level 5 caveat secondary to dementia  PAST MEDICAL HISTORY/PAST SURGICAL HISTORY:  Past Medical History:  Diagnosis Date  . Alzheimer's disease ([place])   . Benign neoplasm of colon   . Cerebrovascular disease   . Dementia ([place])   . Hemorrhoids   . Hypertension   . Loss of weight   . Malignant neoplasm of thyroid gland ([place])   . Malignant neoplasm of thyroid gland ([place])   . Memory loss   . Osteoporosis, unspecified   . Protein calorie malnutrition ([place])   . Reflux esophagitis   . Routine general medical examination at a health care facility   . Screening for lipoid disorders   . Thyroid disease   . Unspecified cataract   . Unspecified constipation   . Unspecified essential hypertension   . Unspecified glaucoma(365.9)   . Unspecified glaucoma(365.9)   . Unspecified hypothyroidism 06/03/2012  . Urge incontinence     MEDICATIONS:  Prior to Admission medications   Medication Sig Start Date End Date Taking? Authorizing Provider  acetaminophen (TYLENOL) 325 MG tablet Take 2 tablets (650 mg total) by mouth  every 8 (eight) hours as needed. 03/19/17   Lauree Chandler, NP  alum & mag hydroxide-simeth (MINTOX) 200-200-20 MG/5ML suspension Take 30 mLs by mouth every 4 (four) hours as needed for indigestion or heartburn.     [provider]  diclofenac sodium (VOLTAREN) 1 % GEL Apply 2 g topically 4 (four) times daily.    [provider]  Glucosamine-Chondroitin 1500-1200 MG/30ML LIQD Take 30 mLs by mouth daily.     [provider]  latanoprost (XALATAN) 0.005 % ophthalmic solution Place 1 drop into both eyes daily. Place 1 drop into both eyes daily for glaucoma. 05/10/15   Reed, Tiffany L, DO  levothyroxine (SYNTHROID, LEVOTHROID) 100 MCG tablet Take 100 mcg and 88 mcg for a total of 188 mcg daily.    [provider]  levothyroxine (SYNTHROID, LEVOTHROID) 88 MCG tablet Take 88 mcg and 100 mcg for a total of 188 mcg daily.    [provider]  magnesium hydroxide (MILK OF MAGNESIA) 400 MG/5ML suspension Take 30 mLs by mouth at bedtime as needed for mild constipation.    [provider]  metoprolol succinate (TOPROL-XL) 25 MG 24 hr tablet Take 25 mg by mouth daily.    [provider]  Multiple Vitamin (MULTIVITAMIN WITH MINERALS) TABS tablet Take 1 tablet by mouth daily.    [provider]  neomycin-bacitracin-polymyxin (NEOSPORIN) OINT Apply 1 application topically as needed for wound  care (for skin tears or abrasions).    [provider]  [place] (OYSTER CALCIUM) 500 MG TABS tablet Take 500 mg of elemental calcium by mouth 2 (two) times daily.    [provider]  polyethylene glycol (MIRALAX / GLYCOLAX) packet Take 17 g by mouth daily.    [provider]  traMADol (ULTRAM) 50 MG tablet Take 1 tablet (50 mg total) by mouth every 8 (eight) hours as needed. 02/20/17   Lauree Chandler, NP  traMADol (ULTRAM) 50 MG tablet ultram to 25 mg by mouth twice daily for 7 days then decrease to 25 mg daily for 7 days  and then DC 02/23/17   Lauree Chandler, NP    ALLERGIES:  No Known Allergies  SOCIAL HISTORY:  Social History   Tobacco Use  . Smoking status: Never Smoker  . Smokeless tobacco: Never Used  Substance Use Topics  . Alcohol use: No    FAMILY HISTORY: Family History  Problem Relation Age of Onset  . Diabetes Father     EXAM: BP 101/61 (BP Location: Left Arm)   Pulse 76   Temp 97.6 F (36.4 C) (Oral)   SpO2 97%  CONSTITUTIONAL: Alert and oriented and responds appropriately to questions.  Very thin.  Elderly.  In no distress. HEAD: Normocephalic; atraumatic EYES: Conjunctivae clear, PERRL, EOMI ENT: normal nose; no rhinorrhea; moist mucous membranes; pharynx without lesions noted; no dental injury; no septal hematoma NECK: Supple, no meningismus, no LAD; no midline spinal tenderness, step-off or deformity; trachea midline CARD: RRR; S1 and S2 appreciated; no murmurs, no clicks, no rubs, no gallops RESP: Normal chest excursion without splinting or tachypnea; breath sounds clear and equal bilaterally; no wheezes, no rhonchi, no rales; no hypoxia or respiratory distress CHEST:  chest wall stable, no crepitus or ecchymosis or deformity, nontender to palpation; no flail chest ABD/GI: Normal bowel sounds; non-distended; soft, non-tender, no rebound, no guarding; no ecchymosis or other lesions noted PELVIS:  stable, nontender to palpation BACK:  The back appears normal and is non-tender to palpation, there is no CVA tenderness; no midline spinal tenderness, step-off or deformity EXT: Complains of bilateral knee pain but full range of motion in both knees and no bony tenderness on exam.  No joint effusion appreciated.  No redness or warmth.  Otherwise lower extremities are nontender to palpation.  Extremities are warm and well-perfused.  Compartments are soft.  No abnormality of her upper extremities. SKIN: Normal color for age and race; warm, small abrasion to the anterior  chest NEURO: Moves all extremities equally, no facial asymmetry, normal speech PSYCH: The patient's mood and manner are appropriate. Grooming and personal hygiene are appropriate.  MEDICAL DECISION MAKING: Patient here with a fall out of her wheelchair onto both of her knees.  X-rays show small suprapatellar effusions but no other acute abnormality.  No fracture.  No dislocation.  No compartment syndrome.  Extremities are well perfused.  Offered Tylenol for pain control.  I feel patient is safe to be discharged home and daughter agrees.  No other sign of trauma on exam other than small abrasion to the anterior chest.  No rib pain on examination.  No hypoxia.  Lungs are clear.  At this time, I do not feel there is any life-threatening condition present. I have reviewed, interpreted and discussed all results (EKG, imaging, lab, urine as appropriate) and exam findings with patient/family. I have reviewed nursing notes and appropriate previous records.  I feel the patient  is safe to be discharged home without further emergent workup and can continue workup as an outpatient as needed. Discussed usual and customary return precautions. Patient/family verbalize understanding and are comfortable with this plan.  Outpatient follow-up has been provided as needed. All questions have been answered.    TESLA KEELER was evaluated in Emergency Department on 02/04/2019 for the symptoms described in the history of present illness. She was evaluated in the context of the global COVID-19 pandemic, which necessitated consideration that the patient might be at risk for infection with the SARS-CoV-2 virus that causes COVID-19. Institutional protocols and algorithms that pertain to the evaluation of patients at risk for COVID-19 are in a state of rapid change based on information released by regulatory bodies including the CDC and federal and state organizations. These policies and algorithms were followed during the patient's  care in the ED.  Patient was seen wearing N95, face shield, gloves.     Kurtiss Wence, Delice Bison, DO 02/04/19 [phone]

## 2019-02-04 NOTE — ED Notes (Signed)
Patient verbalizes understanding of discharge instructions. Opportunity for questioning and answers were provided. Armband removed by staff, pt discharged from ED in wheelchair to home with family.   

## 2019-02-04 NOTE — ED Notes (Signed)
Pts family states if it was possible to leave pt in home chair. Stated that it was okay but if she needed to be transferred for assessment we would assist.

## 2019-02-04 NOTE — Discharge Instructions (Addendum)
You may take over-the-counter Tylenol 1000 mg every 6 hours as needed for pain.  X-ray showed no fracture or dislocation.  Small amount of soft tissue swelling seen on x-ray.  You may apply ice to her knees for 10 to 15 minutes at a time 3-4 times a day to help with pain, swelling.  Please do not apply ice directly to the skin.  Should follow-up with her primary care physician if pain is not improving in 1 week.

## 2019-02-07 DIAGNOSIS — E890 Postprocedural hypothyroidism: Secondary | ICD-10-CM | POA: Diagnosis not present

## 2019-02-07 DIAGNOSIS — R634 Abnormal weight loss: Secondary | ICD-10-CM | POA: Diagnosis not present

## 2019-02-07 DIAGNOSIS — N390 Urinary tract infection, site not specified: Secondary | ICD-10-CM | POA: Diagnosis not present

## 2019-02-07 DIAGNOSIS — R1310 Dysphagia, unspecified: Secondary | ICD-10-CM | POA: Diagnosis not present

## 2019-02-07 DIAGNOSIS — R4701 Aphasia: Secondary | ICD-10-CM | POA: Diagnosis not present

## 2019-02-07 DIAGNOSIS — I129 Hypertensive chronic kidney disease with stage 1 through stage 4 chronic kidney disease, or unspecified chronic kidney disease: Secondary | ICD-10-CM | POA: Diagnosis not present

## 2019-02-07 DIAGNOSIS — N184 Chronic kidney disease, stage 4 (severe): Secondary | ICD-10-CM | POA: Diagnosis not present

## 2019-02-07 DIAGNOSIS — F0150 Vascular dementia without behavioral disturbance: Secondary | ICD-10-CM | POA: Diagnosis not present

## 2019-02-07 DIAGNOSIS — F411 Generalized anxiety disorder: Secondary | ICD-10-CM | POA: Diagnosis not present

## 2019-02-08 DIAGNOSIS — F411 Generalized anxiety disorder: Secondary | ICD-10-CM | POA: Diagnosis not present

## 2019-02-08 DIAGNOSIS — F0150 Vascular dementia without behavioral disturbance: Secondary | ICD-10-CM | POA: Diagnosis not present

## 2019-02-08 DIAGNOSIS — I129 Hypertensive chronic kidney disease with stage 1 through stage 4 chronic kidney disease, or unspecified chronic kidney disease: Secondary | ICD-10-CM | POA: Diagnosis not present

## 2019-02-08 DIAGNOSIS — N390 Urinary tract infection, site not specified: Secondary | ICD-10-CM | POA: Diagnosis not present

## 2019-02-08 DIAGNOSIS — R1310 Dysphagia, unspecified: Secondary | ICD-10-CM | POA: Diagnosis not present

## 2019-02-08 DIAGNOSIS — R4701 Aphasia: Secondary | ICD-10-CM | POA: Diagnosis not present

## 2019-02-08 DIAGNOSIS — R634 Abnormal weight loss: Secondary | ICD-10-CM | POA: Diagnosis not present

## 2019-02-08 DIAGNOSIS — E890 Postprocedural hypothyroidism: Secondary | ICD-10-CM | POA: Diagnosis not present

## 2019-02-08 DIAGNOSIS — N184 Chronic kidney disease, stage 4 (severe): Secondary | ICD-10-CM | POA: Diagnosis not present

## 2019-02-11 DIAGNOSIS — R4701 Aphasia: Secondary | ICD-10-CM | POA: Diagnosis not present

## 2019-02-11 DIAGNOSIS — E890 Postprocedural hypothyroidism: Secondary | ICD-10-CM | POA: Diagnosis not present

## 2019-02-11 DIAGNOSIS — F411 Generalized anxiety disorder: Secondary | ICD-10-CM | POA: Diagnosis not present

## 2019-02-11 DIAGNOSIS — I129 Hypertensive chronic kidney disease with stage 1 through stage 4 chronic kidney disease, or unspecified chronic kidney disease: Secondary | ICD-10-CM | POA: Diagnosis not present

## 2019-02-11 DIAGNOSIS — F0150 Vascular dementia without behavioral disturbance: Secondary | ICD-10-CM | POA: Diagnosis not present

## 2019-02-11 DIAGNOSIS — N184 Chronic kidney disease, stage 4 (severe): Secondary | ICD-10-CM | POA: Diagnosis not present

## 2019-02-11 DIAGNOSIS — R634 Abnormal weight loss: Secondary | ICD-10-CM | POA: Diagnosis not present

## 2019-02-11 DIAGNOSIS — R1310 Dysphagia, unspecified: Secondary | ICD-10-CM | POA: Diagnosis not present

## 2019-02-11 DIAGNOSIS — N390 Urinary tract infection, site not specified: Secondary | ICD-10-CM | POA: Diagnosis not present

## 2019-02-12 DIAGNOSIS — F411 Generalized anxiety disorder: Secondary | ICD-10-CM | POA: Diagnosis not present

## 2019-02-12 DIAGNOSIS — N390 Urinary tract infection, site not specified: Secondary | ICD-10-CM | POA: Diagnosis not present

## 2019-02-12 DIAGNOSIS — R634 Abnormal weight loss: Secondary | ICD-10-CM | POA: Diagnosis not present

## 2019-02-12 DIAGNOSIS — N184 Chronic kidney disease, stage 4 (severe): Secondary | ICD-10-CM | POA: Diagnosis not present

## 2019-02-12 DIAGNOSIS — R1310 Dysphagia, unspecified: Secondary | ICD-10-CM | POA: Diagnosis not present

## 2019-02-12 DIAGNOSIS — I129 Hypertensive chronic kidney disease with stage 1 through stage 4 chronic kidney disease, or unspecified chronic kidney disease: Secondary | ICD-10-CM | POA: Diagnosis not present

## 2019-02-12 DIAGNOSIS — F0150 Vascular dementia without behavioral disturbance: Secondary | ICD-10-CM | POA: Diagnosis not present

## 2019-02-12 DIAGNOSIS — R4701 Aphasia: Secondary | ICD-10-CM | POA: Diagnosis not present

## 2019-02-12 DIAGNOSIS — E890 Postprocedural hypothyroidism: Secondary | ICD-10-CM | POA: Diagnosis not present

## 2019-02-15 DIAGNOSIS — E890 Postprocedural hypothyroidism: Secondary | ICD-10-CM | POA: Diagnosis not present

## 2019-02-15 DIAGNOSIS — F411 Generalized anxiety disorder: Secondary | ICD-10-CM | POA: Diagnosis not present

## 2019-02-15 DIAGNOSIS — R1310 Dysphagia, unspecified: Secondary | ICD-10-CM | POA: Diagnosis not present

## 2019-02-15 DIAGNOSIS — N184 Chronic kidney disease, stage 4 (severe): Secondary | ICD-10-CM | POA: Diagnosis not present

## 2019-02-15 DIAGNOSIS — R634 Abnormal weight loss: Secondary | ICD-10-CM | POA: Diagnosis not present

## 2019-02-15 DIAGNOSIS — F0150 Vascular dementia without behavioral disturbance: Secondary | ICD-10-CM | POA: Diagnosis not present

## 2019-02-15 DIAGNOSIS — N390 Urinary tract infection, site not specified: Secondary | ICD-10-CM | POA: Diagnosis not present

## 2019-02-15 DIAGNOSIS — I129 Hypertensive chronic kidney disease with stage 1 through stage 4 chronic kidney disease, or unspecified chronic kidney disease: Secondary | ICD-10-CM | POA: Diagnosis not present

## 2019-02-15 DIAGNOSIS — R4701 Aphasia: Secondary | ICD-10-CM | POA: Diagnosis not present

## 2019-02-18 DIAGNOSIS — R634 Abnormal weight loss: Secondary | ICD-10-CM | POA: Diagnosis not present

## 2019-02-18 DIAGNOSIS — R1310 Dysphagia, unspecified: Secondary | ICD-10-CM | POA: Diagnosis not present

## 2019-02-18 DIAGNOSIS — N184 Chronic kidney disease, stage 4 (severe): Secondary | ICD-10-CM | POA: Diagnosis not present

## 2019-02-18 DIAGNOSIS — F0150 Vascular dementia without behavioral disturbance: Secondary | ICD-10-CM | POA: Diagnosis not present

## 2019-02-18 DIAGNOSIS — I129 Hypertensive chronic kidney disease with stage 1 through stage 4 chronic kidney disease, or unspecified chronic kidney disease: Secondary | ICD-10-CM | POA: Diagnosis not present

## 2019-02-18 DIAGNOSIS — F411 Generalized anxiety disorder: Secondary | ICD-10-CM | POA: Diagnosis not present

## 2019-02-18 DIAGNOSIS — R4701 Aphasia: Secondary | ICD-10-CM | POA: Diagnosis not present

## 2019-02-18 DIAGNOSIS — E890 Postprocedural hypothyroidism: Secondary | ICD-10-CM | POA: Diagnosis not present

## 2019-02-18 DIAGNOSIS — N390 Urinary tract infection, site not specified: Secondary | ICD-10-CM | POA: Diagnosis not present

## 2019-02-19 ENCOUNTER — Telehealth: Payer: Self-pay | Admitting: *Deleted

## 2019-02-19 NOTE — Telephone Encounter (Signed)
Jill Gutierrez with [facility] with Hospice called requesting verbal orders for Hospice.   I explains to her that we have not seen patient since 06/14/2017. OV note stated that she was being admitted to [facility].  In the Medical Record it looks like [name] with [facility] has been following patient.   Jill Gutierrez stated that she will start there to obtain the Hospice orders.

## 2019-02-19 NOTE — Telephone Encounter (Signed)
Noted, we probably should be removed from her PCP

## 2019-02-19 NOTE — Telephone Encounter (Signed)
Removed

## 2019-02-20 DIAGNOSIS — R4701 Aphasia: Secondary | ICD-10-CM | POA: Diagnosis not present

## 2019-02-20 DIAGNOSIS — E890 Postprocedural hypothyroidism: Secondary | ICD-10-CM | POA: Diagnosis not present

## 2019-02-20 DIAGNOSIS — R634 Abnormal weight loss: Secondary | ICD-10-CM | POA: Diagnosis not present

## 2019-02-20 DIAGNOSIS — R1310 Dysphagia, unspecified: Secondary | ICD-10-CM | POA: Diagnosis not present

## 2019-02-20 DIAGNOSIS — N390 Urinary tract infection, site not specified: Secondary | ICD-10-CM | POA: Diagnosis not present

## 2019-02-20 DIAGNOSIS — N184 Chronic kidney disease, stage 4 (severe): Secondary | ICD-10-CM | POA: Diagnosis not present

## 2019-02-20 DIAGNOSIS — I129 Hypertensive chronic kidney disease with stage 1 through stage 4 chronic kidney disease, or unspecified chronic kidney disease: Secondary | ICD-10-CM | POA: Diagnosis not present

## 2019-02-20 DIAGNOSIS — F0150 Vascular dementia without behavioral disturbance: Secondary | ICD-10-CM | POA: Diagnosis not present

## 2019-02-20 DIAGNOSIS — F411 Generalized anxiety disorder: Secondary | ICD-10-CM | POA: Diagnosis not present

## 2019-02-22 DIAGNOSIS — R1310 Dysphagia, unspecified: Secondary | ICD-10-CM | POA: Diagnosis not present

## 2019-02-22 DIAGNOSIS — R4701 Aphasia: Secondary | ICD-10-CM | POA: Diagnosis not present

## 2019-02-22 DIAGNOSIS — F0150 Vascular dementia without behavioral disturbance: Secondary | ICD-10-CM | POA: Diagnosis not present

## 2019-02-22 DIAGNOSIS — E890 Postprocedural hypothyroidism: Secondary | ICD-10-CM | POA: Diagnosis not present

## 2019-02-22 DIAGNOSIS — I129 Hypertensive chronic kidney disease with stage 1 through stage 4 chronic kidney disease, or unspecified chronic kidney disease: Secondary | ICD-10-CM | POA: Diagnosis not present

## 2019-02-22 DIAGNOSIS — R634 Abnormal weight loss: Secondary | ICD-10-CM | POA: Diagnosis not present

## 2019-02-22 DIAGNOSIS — N184 Chronic kidney disease, stage 4 (severe): Secondary | ICD-10-CM | POA: Diagnosis not present

## 2019-02-22 DIAGNOSIS — N390 Urinary tract infection, site not specified: Secondary | ICD-10-CM | POA: Diagnosis not present

## 2019-02-22 DIAGNOSIS — F411 Generalized anxiety disorder: Secondary | ICD-10-CM | POA: Diagnosis not present

## 2019-02-24 DIAGNOSIS — R4701 Aphasia: Secondary | ICD-10-CM | POA: Diagnosis not present

## 2019-02-24 DIAGNOSIS — N184 Chronic kidney disease, stage 4 (severe): Secondary | ICD-10-CM | POA: Diagnosis not present

## 2019-02-24 DIAGNOSIS — N390 Urinary tract infection, site not specified: Secondary | ICD-10-CM | POA: Diagnosis not present

## 2019-02-24 DIAGNOSIS — E890 Postprocedural hypothyroidism: Secondary | ICD-10-CM | POA: Diagnosis not present

## 2019-02-24 DIAGNOSIS — R1310 Dysphagia, unspecified: Secondary | ICD-10-CM | POA: Diagnosis not present

## 2019-02-24 DIAGNOSIS — I129 Hypertensive chronic kidney disease with stage 1 through stage 4 chronic kidney disease, or unspecified chronic kidney disease: Secondary | ICD-10-CM | POA: Diagnosis not present

## 2019-02-24 DIAGNOSIS — F0150 Vascular dementia without behavioral disturbance: Secondary | ICD-10-CM | POA: Diagnosis not present

## 2019-02-24 DIAGNOSIS — R634 Abnormal weight loss: Secondary | ICD-10-CM | POA: Diagnosis not present

## 2019-02-24 DIAGNOSIS — F411 Generalized anxiety disorder: Secondary | ICD-10-CM | POA: Diagnosis not present

## 2019-03-24 ENCOUNTER — Ambulatory Visit: Admission: EM | Admit: 2019-03-24 | Discharge: 2019-03-24 | Disposition: A | Discharge status: Home / Self Care

## 2019-03-24 ENCOUNTER — Other Ambulatory Visit: Payer: Self-pay

## 2019-03-24 ENCOUNTER — Ambulatory Visit (INDEPENDENT_AMBULATORY_CARE_PROVIDER_SITE_OTHER)

## 2019-03-24 DIAGNOSIS — M7989 Other specified soft tissue disorders: Secondary | ICD-10-CM | POA: Diagnosis not present

## 2019-03-24 DIAGNOSIS — M19041 Primary osteoarthritis, right hand: Secondary | ICD-10-CM | POA: Diagnosis not present

## 2019-03-24 DIAGNOSIS — R2231 Localized swelling, mass and lump, right upper limb: Secondary | ICD-10-CM | POA: Diagnosis not present

## 2019-03-24 NOTE — ED Triage Notes (Signed)
Patient is accompanied by her daughter who reports that one week ago the caregiver reported swelling in patient's right arm and hand.  Daughter is unaware of a fall or other injury.

## 2019-03-24 NOTE — ED Provider Notes (Signed)
[facility]    CSN: [id number] Arrival date & time: 03/24/19  1314      History   Chief Complaint Chief Complaint  Patient presents with  . Hand Pain  . Arm Pain    HPI Jill Gutierrez is a 86 y.o. female.   86 year old female with history of Alzheimer's comes in with daughter for right hand/wrist swelling.  Daughter states has noticed this for the past week, no known injury/trauma.  However, patient has been complaining about pain.  Has been doing ice compress for the past week with mild improvement.  Given continued swelling, they came in for evaluation.  Patient with Alzheimer's, unable to answer questions.  She is wheelchair-bound, without much movement/activity. On morphine for pain.      Past Medical History:  Diagnosis Date  . Alzheimer's disease ([place])   . Benign neoplasm of colon   . Cerebrovascular disease   . Dementia ([place])   . Hemorrhoids   . Hypertension   . Loss of weight   . Malignant neoplasm of thyroid gland ([place])   . Malignant neoplasm of thyroid gland ([place])   . Memory loss   . Osteoporosis, unspecified   . Protein calorie malnutrition ([place])   . Reflux esophagitis   . Routine general medical examination at a health care facility   . Screening for lipoid disorders   . Thyroid disease   . Unspecified cataract   . Unspecified constipation   . Unspecified essential hypertension   . Unspecified glaucoma(365.9)   . Unspecified glaucoma(365.9)   . Unspecified hypothyroidism 06/03/2012  . Urge incontinence     Patient Active Problem List   Diagnosis Date Noted  . Cutaneous fungal infection 10/29/2014  . Hemorrhoid 11/20/2013  . Dysphagia 11/20/2013  . Urinary incontinence due to cognitive impairment 06/03/2012  . Senile osteoporosis 06/03/2012  . Vascular dementia ([place]) 06/03/2012  . Hypothyroidism 06/03/2012  . Hyperlipidemia LDL goal < 100 06/03/2012  . Dementia ([place]) 09/25/2011  . Dyslipidemia 09/25/2011    Past Surgical History:   Procedure Laterality Date  . EYE SURGERY    . hemorrhoidectomy    . NO PAST SURGERIES    . THYROIDECTOMY      OB History   No obstetric history on file.      Home Medications    Prior to Admission medications   Medication Sig Start Date End Date Taking? Authorizing Provider  morphine 20 MG/5ML solution Take 0.5 mg by mouth every 2 (two) hours as needed for pain.   Yes [provider]  acetaminophen (TYLENOL) 325 MG tablet Take 2 tablets (650 mg total) by mouth every 8 (eight) hours as needed. 03/19/17   Lauree Chandler, NP  Glucosamine-Chondroitin 1500-1200 MG/30ML LIQD Take 30 mLs by mouth daily.     [provider]  latanoprost (XALATAN) 0.005 % ophthalmic solution Place 1 drop into both eyes daily. Place 1 drop into both eyes daily for glaucoma. 05/10/15   Reed, Tiffany L, DO  levothyroxine (SYNTHROID, LEVOTHROID) 100 MCG tablet Take 200 mcg by mouth. Take 100 mcg and 88 mcg for a total of 188 mcg daily.     [provider]  levothyroxine (SYNTHROID, LEVOTHROID) 88 MCG tablet Take 88 mcg and 100 mcg for a total of 188 mcg daily.    [provider]  Multiple Vitamin (MULTIVITAMIN WITH MINERALS) TABS tablet Take 1 tablet by mouth daily.    [provider]  neomycin-bacitracin-polymyxin (NEOSPORIN) OINT Apply 1 application topically  as needed for wound care (for skin tears or abrasions).    [provider]  Loma Boston (OYSTER CALCIUM) 500 MG TABS tablet Take 500 mg of elemental calcium by mouth 2 (two) times daily.    [provider]  polyethylene glycol (MIRALAX / GLYCOLAX) packet Take 17 g by mouth daily.    [provider]    Family History Family History  Problem Relation Age of Onset  . Diabetes Father     Social History Social History   Tobacco Use  . Smoking status: Never Smoker  . Smokeless tobacco: Never Used  Substance Use Topics  . Alcohol use: No  . Drug use: No     Allergies    Patient has no known allergies.   Review of Systems Review of Systems  Unable to perform ROS: Dementia     Physical Exam Triage Vital Signs ED Triage Vitals [03/24/19 1346]  Enc Vitals Group     BP 116/79     Pulse Rate 90     Resp 17     Temp (!) 96.5 F (35.8 C)     Temp Source Temporal     SpO2 90 %     Weight      Height      Head Circumference      Peak Flow      Pain Score      Pain Loc      Pain Edu?      Excl. in [place]?    No data found.  Updated Vital Signs BP 116/79 (BP Location: Left Arm)   Pulse 90   Temp (!) 96.5 F (35.8 C) (Temporal)   Resp 17   SpO2 90%   Visual Acuity Right Eye Distance:   Left Eye Distance:   Bilateral Distance:    Right Eye Near:   Left Eye Near:    Bilateral Near:     Physical Exam Constitutional:      General: She is not in acute distress.    Appearance: She is well-developed. She is not toxic-appearing or diaphoretic.  HENT:     Head: Normocephalic and atraumatic.  Eyes:     Conjunctiva/sclera: Conjunctivae normal.     Pupils: Pupils are equal, round, and reactive to light.  Pulmonary:     Effort: Pulmonary effort is normal. No respiratory distress.     Comments: Speaking in full sentences without difficulty Musculoskeletal:     Cervical back: Normal range of motion and neck supple.     Comments: In wheelchair.  Right proximal hand/wrist swelling without any contusion, erythema, warmth.  No tenderness to palpation of the wrist/hand. Patient does not follow any directions. Unable to assess ROM, strength, sensation. Radial pulse 2+  Skin:    General: Skin is warm and dry.  Neurological:     Mental Status: She is alert and oriented to person, place, and time.      UC Treatments / Results  Labs (all labs ordered are listed, but only abnormal results are displayed) Labs Reviewed - No data to display  EKG   Radiology DG Wrist Complete Right  Result Date: 03/24/2019 CLINICAL DATA:  86 year old patient who  is nonverbal. The patient's caregiver identified RIGHT hand and wrist swelling which began approximately 1 week ago. No known injury. EXAM: RIGHT WRIST - COMPLETE 3+ VIEW COMPARISON:  None. FINDINGS: Diffuse soft tissue swelling. No evidence of acute, subacute or healed fractures. Moderate narrowing of the scaphotrapezium joint space. Remaining  joint spaces well-preserved. Mild osseous demineralization. IMPRESSION: 1. No acute or subacute osseous abnormality. 2. Moderate osteoarthritis involving the scaphotrapezium joint. 3. Mild osseous demineralization. Electronically Signed   By: Evangeline Dakin M.D.   On: 03/24/2019 15:05   DG Hand Complete Right  Result Date: 03/24/2019 CLINICAL DATA:  86 year old patient who is nonverbal. The patient's caregiver identified RIGHT hand and wrist swelling which began approximately 1 week ago. No known injury. EXAM: RIGHT HAND - COMPLETE 3+ VIEW COMPARISON:  None. FINDINGS: Mild soft tissue swelling involving the proximal hand. No evidence of acute or subacute fracture or dislocation. Narrowing of the IP joints of all the fingers and thumb. MTP joint spaces well-preserved. Mild osseous demineralization. IMPRESSION: 1. No acute or subacute osseous abnormality. 2. Osteoarthritis involving the IP joints of the fingers and thumb. 3. Mild osseous demineralization. Electronically Signed   By: Evangeline Dakin M.D.   On: 03/24/2019 15:07    Procedures Procedures (including critical care time)  Medications Ordered in UC Medications - No data to display  Initial Impression / Assessment and Plan / UC Course  I have reviewed the triage vital signs and the nursing notes.  Pertinent labs & imaging results that were available during my care of the patient were reviewed by me and considered in my medical decision making (see chart for details).    X-ray negative for fracture or dislocation.  Will provide symptomatic treatment with wrist splint.  Continue current pain  medications.  Ice compress.  Follow-up with PCP for reevaluation if symptoms not improving.  Final Clinical Impressions(s) / UC Diagnoses   Final diagnoses:  Swelling of right hand   ED Prescriptions    None     PDMP not reviewed this encounter.   Ok Edwards, PA-C 03/24/19 1539

## 2019-03-24 NOTE — Discharge Instructions (Addendum)
Xray negative. Continue pain medications and ice compress. Wrist splint as needed. Follow up with PCP in 1-2 weeks if symptoms not improving.

## 2019-03-27 ENCOUNTER — Encounter (HOSPITAL_COMMUNITY): Payer: Self-pay

## 2019-03-27 ENCOUNTER — Emergency Department (HOSPITAL_COMMUNITY)

## 2019-03-27 ENCOUNTER — Other Ambulatory Visit: Payer: Self-pay

## 2019-03-27 ENCOUNTER — Inpatient Hospital Stay (HOSPITAL_COMMUNITY)
Admission: EM | Admit: 2019-03-27 | Discharge: 2019-03-30 | DRG: 640 | Disposition: A | Discharge status: Hospice | Attending: Internal Medicine | Admitting: Internal Medicine

## 2019-03-27 DIAGNOSIS — S31000A Unspecified open wound of lower back and pelvis without penetration into retroperitoneum, initial encounter: Secondary | ICD-10-CM

## 2019-03-27 DIAGNOSIS — R1310 Dysphagia, unspecified: Secondary | ICD-10-CM | POA: Diagnosis present

## 2019-03-27 DIAGNOSIS — K2100 Gastro-esophageal reflux disease with esophagitis, without bleeding: Secondary | ICD-10-CM | POA: Diagnosis present

## 2019-03-27 DIAGNOSIS — E039 Hypothyroidism, unspecified: Secondary | ICD-10-CM | POA: Diagnosis present

## 2019-03-27 DIAGNOSIS — L89154 Pressure ulcer of sacral region, stage 4: Secondary | ICD-10-CM | POA: Diagnosis present

## 2019-03-27 DIAGNOSIS — Z6821 Body mass index (BMI) 21.0-21.9, adult: Secondary | ICD-10-CM

## 2019-03-27 DIAGNOSIS — Z833 Family history of diabetes mellitus: Secondary | ICD-10-CM

## 2019-03-27 DIAGNOSIS — G309 Alzheimer's disease, unspecified: Secondary | ICD-10-CM | POA: Diagnosis present

## 2019-03-27 DIAGNOSIS — H409 Unspecified glaucoma: Secondary | ICD-10-CM | POA: Diagnosis present

## 2019-03-27 DIAGNOSIS — Z515 Encounter for palliative care: Secondary | ICD-10-CM | POA: Diagnosis not present

## 2019-03-27 DIAGNOSIS — I10 Essential (primary) hypertension: Secondary | ICD-10-CM | POA: Diagnosis present

## 2019-03-27 DIAGNOSIS — Z7401 Bed confinement status: Secondary | ICD-10-CM

## 2019-03-27 DIAGNOSIS — M810 Age-related osteoporosis without current pathological fracture: Secondary | ICD-10-CM | POA: Diagnosis present

## 2019-03-27 DIAGNOSIS — L0390 Cellulitis, unspecified: Secondary | ICD-10-CM

## 2019-03-27 DIAGNOSIS — R627 Adult failure to thrive: Secondary | ICD-10-CM | POA: Diagnosis present

## 2019-03-27 DIAGNOSIS — L89314 Pressure ulcer of right buttock, stage 4: Secondary | ICD-10-CM | POA: Diagnosis present

## 2019-03-27 DIAGNOSIS — Z66 Do not resuscitate: Secondary | ICD-10-CM | POA: Diagnosis present

## 2019-03-27 DIAGNOSIS — I679 Cerebrovascular disease, unspecified: Secondary | ICD-10-CM | POA: Diagnosis present

## 2019-03-27 DIAGNOSIS — E876 Hypokalemia: Secondary | ICD-10-CM | POA: Diagnosis not present

## 2019-03-27 DIAGNOSIS — Z20822 Contact with and (suspected) exposure to covid-19: Secondary | ICD-10-CM | POA: Diagnosis present

## 2019-03-27 DIAGNOSIS — E860 Dehydration: Secondary | ICD-10-CM | POA: Diagnosis present

## 2019-03-27 DIAGNOSIS — Z85850 Personal history of malignant neoplasm of thyroid: Secondary | ICD-10-CM

## 2019-03-27 DIAGNOSIS — L89324 Pressure ulcer of left buttock, stage 4: Secondary | ICD-10-CM | POA: Diagnosis present

## 2019-03-27 DIAGNOSIS — F0280 Dementia in other diseases classified elsewhere without behavioral disturbance: Secondary | ICD-10-CM | POA: Diagnosis present

## 2019-03-27 DIAGNOSIS — E870 Hyperosmolality and hypernatremia: Secondary | ICD-10-CM | POA: Diagnosis not present

## 2019-03-27 DIAGNOSIS — F0390 Unspecified dementia without behavioral disturbance: Secondary | ICD-10-CM | POA: Diagnosis present

## 2019-03-27 LAB — URINALYSIS, ROUTINE W REFLEX MICROSCOPIC
Bilirubin Urine: NEGATIVE
Glucose, UA: NEGATIVE mg/dL
Ketones, ur: 5 mg/dL — AB
Leukocytes,Ua: NEGATIVE
Nitrite: NEGATIVE
Protein, ur: 30 mg/dL — AB
Specific Gravity, Urine: 1.021 (ref 1.005–1.030)
pH: 5 (ref 5.0–8.0)

## 2019-03-27 LAB — CBC WITH DIFFERENTIAL/PLATELET
Abs Immature Granulocytes: 0.04 10*3/uL (ref 0.00–0.07)
Basophils Absolute: 0 10*3/uL (ref 0.0–0.1)
Basophils Relative: 0 %
Eosinophils Absolute: 0 10*3/uL (ref 0.0–0.5)
Eosinophils Relative: 0 %
HCT: 48.6 % — ABNORMAL HIGH (ref 36.0–46.0)
Hemoglobin: 14.3 g/dL (ref 12.0–15.0)
Immature Granulocytes: 0 %
Lymphocytes Relative: 6 %
Lymphs Abs: 0.7 10*3/uL (ref 0.7–4.0)
MCH: 29.2 pg (ref 26.0–34.0)
MCHC: 29.4 g/dL — ABNORMAL LOW (ref 30.0–36.0)
MCV: 99.2 fL (ref 80.0–100.0)
Monocytes Absolute: 0.8 10*3/uL (ref 0.1–1.0)
Monocytes Relative: 7 %
Neutro Abs: 9.3 10*3/uL — ABNORMAL HIGH (ref 1.7–7.7)
Neutrophils Relative %: 87 %
Platelets: 166 10*3/uL (ref 150–400)
RBC: 4.9 MIL/uL (ref 3.87–5.11)
RDW: 12.7 % (ref 11.5–15.5)
WBC: 10.8 10*3/uL — ABNORMAL HIGH (ref 4.0–10.5)
nRBC: 0 % (ref 0.0–0.2)

## 2019-03-27 LAB — BASIC METABOLIC PANEL
Anion gap: 12 (ref 5–15)
BUN: 31 mg/dL — ABNORMAL HIGH (ref 8–23)
CO2: 28 mmol/L (ref 22–32)
Calcium: 9 mg/dL (ref 8.9–10.3)
Chloride: 115 mmol/L — ABNORMAL HIGH (ref 98–111)
Creatinine, Ser: 0.97 mg/dL (ref 0.44–1.00)
GFR calc Af Amer: 56 mL/min — ABNORMAL LOW (ref >60)
GFR calc non Af Amer: 48 mL/min — ABNORMAL LOW (ref >60)
Glucose, Bld: 118 mg/dL — ABNORMAL HIGH (ref 70–99)
Potassium: 3.9 mmol/L (ref 3.5–5.1)
Sodium: 155 mmol/L — ABNORMAL HIGH (ref 135–145)

## 2019-03-27 LAB — SEDIMENTATION RATE: Sed Rate: 26 mm/hr — ABNORMAL HIGH (ref 0–22)

## 2019-03-27 LAB — TSH: TSH: 0.025 u[IU]/mL — ABNORMAL LOW (ref 0.350–4.500)

## 2019-03-27 LAB — C-REACTIVE PROTEIN: CRP: 16.5 mg/dL — ABNORMAL HIGH (ref <1.0)

## 2019-03-27 MED ORDER — DEXTROSE 5 % IV SOLN
INTRAVENOUS | Status: DC
  Administered 2019-03-28: 18:00:00 75 mL/h via INTRAVENOUS

## 2019-03-27 MED ORDER — LATANOPROST 0.005 % OP SOLN
1.0000 [drp] | Freq: Every day | OPHTHALMIC | Status: DC
  Administered 2019-03-27 – 2019-03-30 (×4): 1 [drp] via OPHTHALMIC
  Filled 2019-03-27: qty 2.5

## 2019-03-27 MED ORDER — POLYETHYLENE GLYCOL 3350 17 G PO PACK: 17.0000 g | PACK | Freq: Every day | ORAL | Status: DC | PRN

## 2019-03-27 MED ORDER — LORAZEPAM 0.5 MG PO TABS: 0.5000 mg | ORAL_TABLET | ORAL | Status: DC | PRN

## 2019-03-27 MED ORDER — ENOXAPARIN SODIUM 30 MG/0.3ML ~~LOC~~ SOLN
40.0000 mg | SUBCUTANEOUS | Status: DC
  Administered 2019-03-27 – 2019-03-28 (×2): 40 mg via SUBCUTANEOUS
  Filled 2019-03-27 (×2): qty 0.6

## 2019-03-27 MED ORDER — LEVOTHYROXINE SODIUM 100 MCG PO TABS
200.0000 ug | ORAL_TABLET | Freq: Every day | ORAL | Status: DC
  Filled 2019-03-27: qty 2

## 2019-03-27 MED ORDER — MORPHINE SULFATE (PF) 2 MG/ML IV SOLN
1.0000 mg | INTRAVENOUS | Status: DC | PRN
  Administered 2019-03-29: 1 mg via INTRAVENOUS
  Filled 2019-03-27: qty 1

## 2019-03-27 MED ORDER — PIPERACILLIN-TAZOBACTAM 3.375 G IVPB 30 MIN: 3.3750 g | Freq: Once | INTRAVENOUS | Status: DC

## 2019-03-27 MED ORDER — VANCOMYCIN HCL IN DEXTROSE 1-5 GM/200ML-% IV SOLN: 1000.0000 mg | Freq: Once | INTRAVENOUS | Status: DC

## 2019-03-27 MED ORDER — PIPERACILLIN-TAZOBACTAM 3.375 G IVPB
3.3750 g | Freq: Three times a day (TID) | INTRAVENOUS | Status: DC
  Administered 2019-03-27 – 2019-03-29 (×5): 3.375 g via INTRAVENOUS
  Filled 2019-03-27 (×7): qty 50

## 2019-03-27 MED ORDER — BISACODYL 10 MG RE SUPP: 10.0000 mg | Freq: Every day | RECTAL | Status: DC | PRN

## 2019-03-27 MED ORDER — MORPHINE SULFATE 10 MG/5ML PO SOLN
0.5000 mg | ORAL | Status: DC | PRN
  Administered 2019-03-30: 15:00:00 0.5 mg via ORAL
  Filled 2019-03-27: qty 5

## 2019-03-27 MED ORDER — ONDANSETRON HCL 4 MG/2ML IJ SOLN: 4.0000 mg | Freq: Four times a day (QID) | INTRAMUSCULAR | Status: DC | PRN

## 2019-03-27 MED ORDER — ACETAMINOPHEN 650 MG RE SUPP: 650.0000 mg | Freq: Four times a day (QID) | RECTAL | Status: DC | PRN

## 2019-03-27 MED ORDER — ONDANSETRON HCL 4 MG PO TABS: 4.0000 mg | ORAL_TABLET | Freq: Four times a day (QID) | ORAL | Status: DC | PRN

## 2019-03-27 MED ORDER — VANCOMYCIN HCL IN DEXTROSE 1-5 GM/200ML-% IV SOLN
1000.0000 mg | Freq: Once | INTRAVENOUS | Status: AC
  Administered 2019-03-27: 20:00:00 1000 mg via INTRAVENOUS
  Filled 2019-03-27: qty 200

## 2019-03-27 MED ORDER — VANCOMYCIN HCL 750 MG/150ML IV SOLN
750.0000 mg | INTRAVENOUS | Status: DC
  Administered 2019-03-29: 20:00:00 750 mg via INTRAVENOUS
  Filled 2019-03-27: qty 150

## 2019-03-27 MED ORDER — MORPHINE SULFATE (PF) 2 MG/ML IV SOLN
1.0000 mg | INTRAVENOUS | Status: AC
  Filled 2019-03-27: qty 1

## 2019-03-27 MED ORDER — ACETAMINOPHEN 325 MG PO TABS: 650.0000 mg | ORAL_TABLET | Freq: Four times a day (QID) | ORAL | Status: DC | PRN

## 2019-03-27 NOTE — ED Triage Notes (Signed)
Pt BIBA from home.   Per EMS- Pt with sacral ulcer, family wanted pt to be evaluated citing "hospice is not doing anything about it."

## 2019-03-27 NOTE — Progress Notes (Signed)
[facility] Documentation  Pt is a current hospice pt with [facility]. Liaison notes that pt's family called EMS due to sacral ulcer. Pt's family has not called [facility] today for assistance. We are happy to provide wound care visit upon discharge.   Please call with any questions.  Thank you,  Freddie Breech, RN [facility] Liaison  [phone]

## 2019-03-27 NOTE — ED Provider Notes (Signed)
[facility] DEPT Provider Note   CSN: [id number] Arrival date & time: 03/27/19  1304     History Chief Complaint  Patient presents with  . Skin Ulcer    Jill Gutierrez is a 86 y.o. female.  Presents to the ER with concern for worsening wound.  Patient has had wound for approximately 3 weeks over her left sacrum, daughter reports that over the last few days she has had increased purulent drainage, very foul-smelling.  No fevers, patient has had increased generalized weakness, fatigue.  Has not been any antibiotics.  Patient is enrolled in hospice care for her Alzheimer's dementia and age.  HPI     Past Medical History:  Diagnosis Date  . Alzheimer's disease ([place])   . Benign neoplasm of colon   . Cerebrovascular disease   . Dementia ([place])   . Hemorrhoids   . Hypertension   . Loss of weight   . Malignant neoplasm of thyroid gland ([place])   . Malignant neoplasm of thyroid gland ([place])   . Memory loss   . Osteoporosis, unspecified   . Protein calorie malnutrition ([place])   . Reflux esophagitis   . Routine general medical examination at a health care facility   . Screening for lipoid disorders   . Thyroid disease   . Unspecified cataract   . Unspecified constipation   . Unspecified essential hypertension   . Unspecified glaucoma(365.9)   . Unspecified glaucoma(365.9)   . Unspecified hypothyroidism 06/03/2012  . Urge incontinence     Patient Active Problem List   Diagnosis Date Noted  . Hypernatremia 03/27/2019  . Cutaneous fungal infection 10/29/2014  . Hemorrhoid 11/20/2013  . Dysphagia 11/20/2013  . Urinary incontinence due to cognitive impairment 06/03/2012  . Senile osteoporosis 06/03/2012  . Vascular dementia ([place]) 06/03/2012  . Hypothyroidism 06/03/2012  . Hyperlipidemia LDL goal < 100 06/03/2012  . Dementia ([place]) 09/25/2011  . Dyslipidemia 09/25/2011    Past Surgical History:  Procedure Laterality Date  . EYE SURGERY    .  hemorrhoidectomy    . NO PAST SURGERIES    . THYROIDECTOMY       OB History   No obstetric history on file.     Family History  Problem Relation Age of Onset  . Diabetes Father     Social History   Tobacco Use  . Smoking status: Never Smoker  . Smokeless tobacco: Never Used  Substance Use Topics  . Alcohol use: No  . Drug use: No    Home Medications Prior to Admission medications   Medication Sig Start Date End Date Taking? Authorizing Provider  acetaminophen (TYLENOL) 325 MG tablet Take 2 tablets (650 mg total) by mouth every 8 (eight) hours as needed. 03/19/17  Yes Lauree Chandler, NP  Cholecalciferol (VITAMIN D3) 1.25 MG (50000 UT) CAPS Take 1 capsule by mouth every 30 (thirty) days. 03/10/19  Yes [provider]  EUTHYROX 200 MCG tablet Take 200 mcg by mouth daily. 03/10/19  Yes [provider]  Glucosamine-Chondroitin 1500-1200 MG/30ML LIQD Take 30 mLs by mouth daily.    Yes [provider]  hyoscyamine (LEVSIN) 0.125 MG tablet Take 0.125 mg by mouth every 6 (six) hours as needed for cramping.  03/10/19  Yes [provider]  latanoprost (XALATAN) 0.005 % ophthalmic solution Place 1 drop into both eyes daily. Place 1 drop into both eyes daily for glaucoma. 05/10/15  Yes Reed, Tiffany L, DO  LORazepam (ATIVAN) 0.5 MG tablet Take  0.5 mg by mouth every 4 (four) hours as needed for agitation. 02/24/19  Yes [provider]  morphine 20 MG/5ML solution Take 0.5 mg by mouth every 2 (two) hours as needed for pain.   Yes [provider]  Multiple Vitamin (MULTIVITAMIN WITH MINERALS) TABS tablet Take 1 tablet by mouth daily.   Yes [provider]  neomycin-bacitracin-polymyxin (NEOSPORIN) OINT Apply 1 application topically as needed for wound care (for skin tears or abrasions).   Yes [provider]  Loma Boston (OYSTER CALCIUM) 500 MG TABS tablet Take 500 mg of elemental calcium by mouth 2 (two) times daily.   Yes  [provider]  polyethylene glycol powder (GLYCOLAX/MIRALAX) 17 GM/SCOOP powder Take 17 g by mouth daily as needed for mild constipation. 03/12/19  Yes [provider]    Allergies    Patient has no known allergies.  Review of Systems   Review of Systems  Unable to perform ROS: Dementia    Physical Exam Updated Vital Signs BP 123/76   Pulse 98   Temp 98.6 F (37 C) (Axillary)   Resp 17   SpO2 100%   Physical Exam Vitals and nursing note reviewed.  Constitutional:      General: She is not in acute distress.    Appearance: She is well-developed.  HENT:     Head: Normocephalic and atraumatic.  Eyes:     Conjunctiva/sclera: Conjunctivae normal.  Cardiovascular:     Rate and Rhythm: Normal rate and regular rhythm.     Heart sounds: No murmur.  Pulmonary:     Effort: Pulmonary effort is normal. No respiratory distress.     Breath sounds: Normal breath sounds.  Abdominal:     Palpations: Abdomen is soft.     Tenderness: There is no abdominal tenderness.  Musculoskeletal:     Cervical back: Neck supple.     Comments: Back: left sacrum there is 5cm dia stage 2 borderline 3 pressure ulcer, purulent foul smelling drainage, no underlying induration  Skin:    General: Skin is warm and dry.     Capillary Refill: Capillary refill takes less than 2 seconds.  Neurological:     General: No focal deficit present.     Mental Status: She is alert and oriented to person, place, and time.  Psychiatric:        Mood and Affect: Mood normal.        Behavior: Behavior normal.     ED Results / Procedures / Treatments   Labs (all labs ordered are listed, but only abnormal results are displayed) Labs Reviewed  CBC WITH DIFFERENTIAL/PLATELET - Abnormal; Notable for the following components:      Result Value   WBC 10.8 (*)    HCT 48.6 (*)    MCHC 29.4 (*)    Neutro Abs 9.3 (*)    All other components within normal limits  BASIC METABOLIC PANEL - Abnormal; Notable  for the following components:   Sodium 155 (*)    Chloride 115 (*)    Glucose, Bld 118 (*)    BUN 31 (*)    GFR calc non Af Amer 48 (*)    GFR calc Af Amer 56 (*)    All other components within normal limits  C-REACTIVE PROTEIN - Abnormal; Notable for the following components:   CRP 16.5 (*)    All other components within normal limits  URINALYSIS, ROUTINE W REFLEX MICROSCOPIC - Abnormal; Notable for the following components:  Hgb urine dipstick SMALL (*)    Ketones, ur 5 (*)    Protein, ur 30 (*)    Bacteria, UA RARE (*)    All other components within normal limits  CULTURE, BLOOD (ROUTINE X 2)  CULTURE, BLOOD (ROUTINE X 2)  SARS CORONAVIRUS 2 (TAT 6-24 HRS)  SEDIMENTATION RATE    EKG None  Radiology No results found.  Procedures Procedures (including critical care time)  Medications Ordered in ED Medications  piperacillin-tazobactam (ZOSYN) IVPB 3.375 g (has no administration in time range)  vancomycin (VANCOCIN) IVPB 1000 mg/200 mL premix (has no administration in time range)    ED Course  I have reviewed the triage vital signs and the nursing notes.  Pertinent labs & imaging results that were available during my care of the patient were reviewed by me and considered in my medical decision making (see chart for details).    MDM Rules/Calculators/A&P                      86 year old lady dementia presents to ER with concern for worsening sacral wound.  She has a stage II borderline stage III sacral ulcer, noted to have significant foul-smelling, purulence.  Concern for overlying cellulitis, cannot rule out underlying osteomyelitis.  Had lengthy goals of care conversation with patient's daughter at bedside, though she is enrolled in hospice care, daughter states she and the patient would want antibiotics, further testing performed and patient admission if necessary for treatment of her wound.  Given current appearance, believe she would benefit from admission and IV  antibiotics and advanced imaging.  Started on broad-spectrum antibiotics, obtain blood cultures, ordered MRI to further evaluate.  Will admit to hospitalist service for further management.  [place]. [place] accepting.  Final Clinical Impression(s) / ED Diagnoses Final diagnoses:  Wound of sacral region, initial encounter  Cellulitis, unspecified cellulitis site  Dementia without behavioral disturbance, unspecified dementia type [facility])    Rx / DC Orders ED Discharge Orders    None       Lucrezia Starch, MD 03/27/19 1655

## 2019-03-27 NOTE — ED Notes (Signed)
Attempted to contact Hospice care RN, no answer.  HIPAA compliant voicemail left.

## 2019-03-27 NOTE — ED Notes (Signed)
Patient transported to MRI 

## 2019-03-27 NOTE — ED Notes (Signed)
Per Ebony Hail, Hospice RN, pt had stage 1 decubitus ulcer 2 weeks ago, progressing to stage 2 appx 1 week ago.  Redness noted around the area.  Ebony Hail reports wound was dressed Tuesday, 03/25/2019.  Contact number for Ebony Hail, [place] - [phone]

## 2019-03-27 NOTE — Progress Notes (Signed)
A consult was received from an ED physician for vanc/zosyn per pharmacy dosing.  The patient's profile has been reviewed for ht/wt/allergies/indication/available labs.   A one time order has been placed for vanc 1g and zosyn 3.375g.  Further antibiotics/pharmacy consults should be ordered by admitting physician if indicated.                       Thank you, Kara Mead 03/27/2019  4:47 PM

## 2019-03-27 NOTE — H&P (Signed)
History and Physical    Jill Gutierrez KKX:[id number] DOB: 10/30/1917 DOA: 03/27/2019  PCP: Patient, No Pcp Per  Patient coming from: Home  I have personally briefly reviewed patient's old medical records in [place]  Chief Complaint: Sacral wound  HPI: Jill Gutierrez is a 86 y.o. female with medical history significant of advanced dementia, glaucoma, hypothyroidism currently on home hospice with for care was brought in by daughter for concerns of a new sacral ulcer.  Patient with advanced dementia, currently nonverbal and HPI obtained from daughter Jill Gutierrez.  Apparently, sacral ulcer has now been present for over the last 2 weeks.  She was concerned that this wound was not getting appropriate care by hospice services, which prompted her to bring her into the emergency department today.  She reports that the wound has progressed, and now foul-smelling.  She wishes for as much aggressive care as possible; but she understands any surgical intervention would be unwarranted given her advanced dementia, age and frailty.  Additionally, patient reports that her mother has not eaten or had much fluid intake over the past 3 to 4 days, and no bowel movements during this time as well.  ED Course: Temperature 98.6, HR 98, RR 17, BP 123/76, SPO2 100% on room air.  WBC count 10.8, hemoglobin 14.3, platelets 166.  Sodium 155, potassium 3.9, chloride 115, CO2 28, BUN 31, creatinine 0.97, glucose 118.  CRP 16.5.  Urinalysis unremarkable.  X-rays right hand, left knee, right knee, right wrist with no acute fracture/dislocation/subluxation.  ED physician ordered IV vancomycin and Zosyn.  MRI pelvis pending to evaluate extent of ulcer.  ED physician referred for admission given family's request for aggressive measures to include IV antibiotics and local wound care.  Review of Systems: As per HPI otherwise 10 point review of systems negative.    Past Medical History:  Diagnosis Date  . Alzheimer's disease ([place])     . Benign neoplasm of colon   . Cerebrovascular disease   . Dementia ([place])   . Hemorrhoids   . Hypertension   . Loss of weight   . Malignant neoplasm of thyroid gland ([place])   . Malignant neoplasm of thyroid gland ([place])   . Memory loss   . Osteoporosis, unspecified   . Protein calorie malnutrition ([place])   . Reflux esophagitis   . Routine general medical examination at a health care facility   . Screening for lipoid disorders   . Thyroid disease   . Unspecified cataract   . Unspecified constipation   . Unspecified essential hypertension   . Unspecified glaucoma(365.9)   . Unspecified glaucoma(365.9)   . Unspecified hypothyroidism 06/03/2012  . Urge incontinence     Past Surgical History:  Procedure Laterality Date  . EYE SURGERY    . hemorrhoidectomy    . NO PAST SURGERIES    . THYROIDECTOMY       reports that she has never smoked. She has never used smokeless tobacco. She reports that she does not drink alcohol or use drugs.  No Known Allergies  Family History  Problem Relation Age of Onset  . Diabetes Father     Family history reviewed and not pertinent   Prior to Admission medications   Medication Sig Start Date End Date Taking? Authorizing Provider  acetaminophen (TYLENOL) 325 MG tablet Take 2 tablets (650 mg total) by mouth every 8 (eight) hours as needed. 03/19/17  Yes Lauree Chandler, NP  Cholecalciferol (VITAMIN D3) 1.25 MG (50000 UT)  CAPS Take 1 capsule by mouth every 30 (thirty) days. 03/10/19  Yes [provider]  EUTHYROX 200 MCG tablet Take 200 mcg by mouth daily. 03/10/19  Yes [provider]  Glucosamine-Chondroitin 1500-1200 MG/30ML LIQD Take 30 mLs by mouth daily.    Yes [provider]  hyoscyamine (LEVSIN) 0.125 MG tablet Take 0.125 mg by mouth every 6 (six) hours as needed for cramping.  03/10/19  Yes [provider]  latanoprost (XALATAN) 0.005 % ophthalmic solution Place 1 drop into both eyes daily. Place 1 drop  into both eyes daily for glaucoma. 05/10/15  Yes Reed, Tiffany L, DO  LORazepam (ATIVAN) 0.5 MG tablet Take 0.5 mg by mouth every 4 (four) hours as needed for agitation. 02/24/19  Yes [provider]  morphine 20 MG/5ML solution Take 0.5 mg by mouth every 2 (two) hours as needed for pain.   Yes [provider]  Multiple Vitamin (MULTIVITAMIN WITH MINERALS) TABS tablet Take 1 tablet by mouth daily.   Yes [provider]  neomycin-bacitracin-polymyxin (NEOSPORIN) OINT Apply 1 application topically as needed for wound care (for skin tears or abrasions).   Yes [provider]  [place] (OYSTER CALCIUM) 500 MG TABS tablet Take 500 mg of elemental calcium by mouth 2 (two) times daily.   Yes [provider]  polyethylene glycol powder (GLYCOLAX/MIRALAX) 17 GM/SCOOP powder Take 17 g by mouth daily as needed for mild constipation. 03/12/19  Yes [provider]    Physical Exam: Vitals:   03/27/19 1318 03/27/19 1333 03/27/19 1430 03/27/19 1615  BP:   121/64 123/76  Pulse:   96 98  Resp:   19 17  Temp:      TempSrc:      SpO2: 95% 100% 100% 100%    Constitutional: NAD, calm, nonverbal, nods head; thin and cachectic in appearance Eyes: PERRL, lids and conjunctivae normal ENMT: Mucous membranes are extremely dry. Posterior pharynx clear of any exudate or lesions. Neck: normal, supple, no masses, no thyromegaly Respiratory: clear to auscultation bilaterally, no wheezing, no crackles. Normal respiratory effort. No accessory muscle use.  Oxygenating well on room air Cardiovascular: Regular rate and rhythm, no murmurs / rubs / gallops. No extremity edema. 2+ pedal pulses. No carotid bruits.  Abdomen: no tenderness, no masses palpated. No hepatosplenomegaly. Bowel sounds positive.  Musculoskeletal: Slightly contracted, in fetal position, loss of significant muscle mass Skin: Sacral decubitus ulcer noted, see picture below Neurologic: CN 2-12 grossly  intact. Sensation intact, DTR normal. Strength 5/5 in all 4.  Psychiatric: Nonverbal        Labs on Admission: I have personally reviewed following labs and imaging studies  CBC: Recent Labs  Lab 03/27/19 1512  WBC 10.8*  NEUTROABS 9.3*  HGB 14.3  HCT 48.6*  MCV 99.2  PLT 696   Basic Metabolic Panel: Recent Labs  Lab 03/27/19 1512  NA 155*  K 3.9  CL 115*  CO2 28  GLUCOSE 118*  BUN 31*  CREATININE 0.97  CALCIUM 9.0   GFR: CrCl cannot be calculated (Unknown ideal weight.). Liver Function Tests: No results for input(s): AST, ALT, ALKPHOS, BILITOT, PROT, ALBUMIN in the last 168 hours. No results for input(s): LIPASE, AMYLASE in the last 168 hours. No results for input(s): AMMONIA in the last 168 hours. Coagulation Profile: No results for input(s): INR, PROTIME in the last 168 hours. Cardiac Enzymes: No results for input(s): CKTOTAL, CKMB, CKMBINDEX, TROPONINI in the last 168 hours. BNP (last 3 results) No  results for input(s): PROBNP in the last 8760 hours. HbA1C: No results for input(s): HGBA1C in the last 72 hours. CBG: No results for input(s): GLUCAP in the last 168 hours. Lipid Profile: No results for input(s): CHOL, HDL, LDLCALC, TRIG, CHOLHDL, LDLDIRECT in the last 72 hours. Thyroid Function Tests: No results for input(s): TSH, T4TOTAL, FREET4, T3FREE, THYROIDAB in the last 72 hours. Anemia Panel: No results for input(s): VITAMINB12, FOLATE, FERRITIN, TIBC, IRON, RETICCTPCT in the last 72 hours. Urine analysis:    Component Value Date/Time   COLORURINE YELLOW 03/27/2019 1513   APPEARANCEUR CLEAR 03/27/2019 1513   APPEARANCEUR Cloudy (A) 06/03/2012 1618   LABSPEC 1.021 03/27/2019 1513   PHURINE 5.0 03/27/2019 1513   GLUCOSEU NEGATIVE 03/27/2019 1513   HGBUR SMALL (A) 03/27/2019 1513   BILIRUBINUR NEGATIVE 03/27/2019 1513   BILIRUBINUR Negative 06/03/2012 1618   KETONESUR 5 (A) 03/27/2019 1513   PROTEINUR 30 (A) 03/27/2019 1513   NITRITE NEGATIVE  03/27/2019 1513   LEUKOCYTESUR NEGATIVE 03/27/2019 1513    Radiological Exams on Admission: No results found.  EKG: Independently reviewed.   Assessment/Plan Principal Problem:   Hypernatremia Active Problems:   Dementia ([place])   Hypothyroidism   Stage IV pressure ulcer of sacral region [facility])   Hospice care patient   Hypernatremia Sodium 155 on admission.  Daughter reports poor to no oral intake to include food or water over the past 3 to 4 days.  Etiology likely dehydration.  Free water deficit calculated at 3.0 L. --Start D5W at 100 mL's per hour, goal correction 0.5 mmol/L/hr --Repeat CMP in a.m.  Stage IV pressure ulcer of sacral region, present on admission Daughter reports new onset sacral pressure ulcer over the last 2 weeks, has progressed since.  Now with foul odor.  On examination, there appears to be very hard surface, likely bony extension with osteomyelitis. --MRI pelvis pending --Blood cultures x2: Pending --Check MRSA PCR --Continue IV antibiotics with vancomycin/Zosyn --Wound care consult --Discussed with daughter extensively at bedside, given her age, frailty and advanced dementia, she is not a surgical candidate and she understands this.  But she wishes for as much aggressive care with IV antibiotics and local wound care as possible right now.  Advanced dementia Currently on home hospice with [facility] care.  --Of care consult for assistance with goals of care and medical decision making --Overall poor prognosis, which was relayed to her daughter  Hypothyroidism --Check TSH --Continue Synthroid 200 mcg p.o. daily   DVT prophylaxis: Lovenox Code Status: DNR Family Communication: Discussed plan of care with the patient's daughter Jill Gutierrez at bedside Disposition Plan: Anticipate discharge home with home hospice Consults called: Palliative care consult placed via epic Admission status: Inpatient, medical/surgical unit   Severity of Illness: The appropriate  patient status for this patient is INPATIENT. Inpatient status is judged to be reasonable and necessary in order to provide the required intensity of service to ensure the patient's safety. The patient's presenting symptoms, physical exam findings, and initial radiographic and laboratory data in the context of their chronic comorbidities is felt to place them at high risk for further clinical deterioration. Furthermore, it is not anticipated that the patient will be medically stable for discharge from the hospital within 2 midnights of admission. The following factors support the patient status of inpatient.   " The patient's presenting symptoms include sacral decubitus ulcer, decreased oral intake " The worrisome physical exam findings include sacral decubitus ulcer, thin/cachectic, nonverbal " The initial radiographic and laboratory data are  worrisome because of elevated CRP of 16.5, sodium 155 " The chronic co-morbidities include advanced dementia, hypothyroidism, glaucoma.   * I certify that at the point of admission it is my clinical judgment that the patient will require inpatient hospital care spanning beyond 2 midnights from the point of admission due to high intensity of service, high risk for further deterioration and high frequency of surveillance required.*    Kiyona Mcnall J [place] DO [facility] Available via Epic secure chat 7am-7pm After these hours, please refer to coverage provider listed on [email or web address] 03/27/2019, 5:17 PM

## 2019-03-27 NOTE — ED Notes (Addendum)
MRI unable to be performed d/t pt being unable to lay flat. Admitting provider, [place] made aware.

## 2019-03-27 NOTE — ED Notes (Signed)
Family at bedside. 

## 2019-03-27 NOTE — Progress Notes (Signed)
Pharmacy Antibiotic Note  Jill Gutierrez is a 86 y.o. female admitted on 03/27/2019 with wound infection.  Pharmacy has been consulted for Vanc/Zosyn dosing.  Plan: 1) Vanc 1g x 1 then 750mg  IV q48 - goal AUC 400-550 2) Zosyn 3.375g IV q8 (extended interval infusion) 3) Daily SCr  Height: 5' (152.4 cm) Weight: 106 lb 14.8 oz (48.5 kg) IBW/kg (Calculated) : 45.5  Temp (24hrs), Avg:98.7 F (37.1 C), Min:98.6 F (37 C), Max:98.8 F (37.1 C)  Recent Labs  Lab 03/27/19 1512  WBC 10.8*  CREATININE 0.97    Estimated Creatinine Clearance: 22.7 mL/min (by C-G formula based on SCr of 0.97 mg/dL).    No Known Allergies   Thank you for allowing pharmacy to be a part of this patient's care.  Kara Mead 03/27/2019 7:19 PM

## 2019-03-28 LAB — MRSA PCR SCREENING: MRSA by PCR: NEGATIVE

## 2019-03-28 LAB — T4, FREE: Free T4: 1.99 ng/dL — ABNORMAL HIGH (ref 0.61–1.12)

## 2019-03-28 LAB — COMPREHENSIVE METABOLIC PANEL
ALT: 15 U/L (ref 0–44)
AST: 22 U/L (ref 15–41)
Albumin: 2.4 g/dL — ABNORMAL LOW (ref 3.5–5.0)
Alkaline Phosphatase: 69 U/L (ref 38–126)
Anion gap: 9 (ref 5–15)
BUN: 29 mg/dL — ABNORMAL HIGH (ref 8–23)
CO2: 28 mmol/L (ref 22–32)
Calcium: 8.6 mg/dL — ABNORMAL LOW (ref 8.9–10.3)
Chloride: 112 mmol/L — ABNORMAL HIGH (ref 98–111)
Creatinine, Ser: 0.92 mg/dL (ref 0.44–1.00)
GFR calc Af Amer: 60 mL/min — ABNORMAL LOW (ref >60)
GFR calc non Af Amer: 51 mL/min — ABNORMAL LOW (ref >60)
Glucose, Bld: 146 mg/dL — ABNORMAL HIGH (ref 70–99)
Potassium: 3.6 mmol/L (ref 3.5–5.1)
Sodium: 149 mmol/L — ABNORMAL HIGH (ref 135–145)
Total Bilirubin: 0.6 mg/dL (ref 0.3–1.2)
Total Protein: 5.9 g/dL — ABNORMAL LOW (ref 6.5–8.1)

## 2019-03-28 LAB — CBC
HCT: 42.4 % (ref 36.0–46.0)
Hemoglobin: 13.2 g/dL (ref 12.0–15.0)
MCH: 29.7 pg (ref 26.0–34.0)
MCHC: 31.1 g/dL (ref 30.0–36.0)
MCV: 95.5 fL (ref 80.0–100.0)
Platelets: 132 10*3/uL — ABNORMAL LOW (ref 150–400)
RBC: 4.44 MIL/uL (ref 3.87–5.11)
RDW: 12.7 % (ref 11.5–15.5)
WBC: 6.3 10*3/uL (ref 4.0–10.5)
nRBC: 0 % (ref 0.0–0.2)

## 2019-03-28 LAB — SARS CORONAVIRUS 2 (TAT 6-24 HRS): SARS Coronavirus 2: NEGATIVE

## 2019-03-28 LAB — MAGNESIUM: Magnesium: 2.1 mg/dL (ref 1.7–2.4)

## 2019-03-28 LAB — SODIUM
Sodium: 148 mmol/L — ABNORMAL HIGH (ref 135–145)
Sodium: 149 mmol/L — ABNORMAL HIGH (ref 135–145)

## 2019-03-28 MED ORDER — COLLAGENASE 250 UNIT/GM EX OINT
TOPICAL_OINTMENT | Freq: Every day | CUTANEOUS | Status: DC
  Administered 2019-03-28: 1 via TOPICAL
  Filled 2019-03-28: qty 30

## 2019-03-28 MED ORDER — LEVOTHYROXINE SODIUM 100 MCG/5ML IV SOLN
75.0000 ug | Freq: Every day | INTRAVENOUS | Status: DC
  Administered 2019-03-28 – 2019-03-30 (×3): 75 ug via INTRAVENOUS
  Filled 2019-03-28 (×3): qty 5

## 2019-03-28 NOTE — Progress Notes (Signed)
Manufacturing engineer [facility]) Hospital Liaison GIP RN note.  This is a related and covered [facility] GIP admission of 03/27/2019 with an [place] diagnosis of  Cerebrovascular atherosclerosis. Patient is DNR.  Patient family activated EMS to transport patient to the ED to evaluate worsening  sacral ulcer. Patient was admitted for wound of sacral region and treatment of.   Patient is minimally interactive, opens eyes to voice, does not appear in distress. No PO intake, receiving IV fluids for hydration.   VS: 97.8, 114/76, 91, 18, 98% RA I/O: 1047/897  Abnormal labs: NA 149, Chloride 112, Glucose 146, BUN29, calcium 8.6, Albumin 2.4, Total protein 5.9, GFR 60, Platelets 146  Medications: Continuous- D5@75ml /hr, Zosyn IVPB 3.375g TID,  Vancomycin IVBP 16mmg QOD.  Per MD notes: Assessment/Plan: Principal Problem:   Hypernatremia Active Problems:   Dementia ([place])   Hypothyroidism   Stage IV pressure ulcer of sacral region [facility])   Hospice care patient  Stage IV pressure ulcer of sacral region, present on admission Daughter reports new onset sacral pressure ulcer over the last 2 weeks, has progressed since. Now with foul odor. On examination, there appears to be very hard surface, likely bony extension with osteomyelitis. --MRI is pending. Blood cultures negative to date. MRSA negative Continue IV antibiotics IV vancomycin and Zosyn. Appreciate wound care recommendations.  Hypovolemic hypernatremia likely secondary to dehydration from poor oral intake Presented with a sodium level of 155 Started on D5W with improvement sodium level down to 149 Reduce rate to 75 cc/h and continue to monitor with serum sodium every 6 hours  Advanced dementia Currently on home hospice [facility].  --Palliative care consult for assistance with goals of care and medical decision making --Overall poor prognosis  Hypothyroidism TSH low at 0.025 Ordered free T4 Synthroid dose reduced Switched to IV  since she is not taking oral pills  Dysphagia Speech therapist to assess Aspiration precautions  Goals of Care: Patient is DNR. Spoke with daughter [name] who states she just wanted her mom's wound treated to help her comfort. Fluids for dehydration.  She declined an MRI to evaluate. She anticipates patient returning home 1-2 days. She reports the patient had not been eating for last 4 days and has notable decline over last few weeks.   IDG: team updated.   Communication with PCG: Spoke with [name], daughter and PCG as noted above.   Please use [facility] for ambulance transport for all active [place] patients that require ambulance transport.   Please call with any hospice related questions.   Thank you, Farrel Gordon, RN, CCM [place] (listed on AMION under Hospice and [place] [facility] [place])  [phone]

## 2019-03-28 NOTE — Consult Note (Addendum)
[facility] Nurse Consult Note: Reason for Consult: Consult requested for sacrum wound.  Performed remotely after review of photo and progress notes in the EMR.  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 Wound is 8X8cm, according to nursing flowsheet. Pressure Injury POA: Yes Dressing procedure/placement/frequency: Topical treatment orders provided for bedside nurses to perform daily as follows to assist with removal of nonviable tissue: Apply Santyl to sacrum wound Q day, then cover with moist gauze and ABD pad and tape. Please re-consult if further assistance is needed.  Thank-you,  Julien Girt MSN, [place], [place], [place], [place]

## 2019-03-28 NOTE — Progress Notes (Signed)
PROGRESS NOTE  Jill Gutierrez KPT:[id number] DOB: Aug 21, 1917 DOA: 03/27/2019 PCP: Patient, No Pcp Per  HPI/Recap of past 24 hours: Jill Gutierrez is a 86 y.o. female with medical history significant of advanced dementia, glaucoma, hypothyroidism currently on home hospice with for care was brought in by daughter for concerns of a new sacral ulcer.  Patient with advanced dementia, currently nonverbal and HPI obtained from daughter [name].  Apparently, sacral ulcer has now been present for over the last 2 weeks.  She was concerned that this wound was not getting appropriate care by hospice services, which prompted her to bring her into the emergency department today.  She reports that the wound has progressed, and now foul-smelling.  She wishes for as much aggressive care as possible; but she understands any surgical intervention would be unwarranted given her advanced dementia, age and frailty.  Additionally, patient reports that her mother has not eaten or had much fluid intake over the past 3 to 4 days, and no bowel movements during this time as well.  ED Course: Temperature 98.6, HR 98, RR 17, BP 123/76, SPO2 100% on room air.  WBC count 10.8, hemoglobin 14.3, platelets 166.  Sodium 155, potassium 3.9, chloride 115, CO2 28, BUN 31, creatinine 0.97, glucose 118.  CRP 16.5.  Urinalysis unremarkable.  X-rays right hand, left knee, right knee, right wrist with no acute fracture/dislocation/subluxation.  ED physician ordered IV vancomycin and Zosyn.  MRI pelvis pending to evaluate extent of ulcer.  ED physician referred for admission given family's request for aggressive measures to include IV antibiotics and local wound care.  03/28/19: Seen and examined.  Minimally interactive.  Open her eyes when talked to, does not appear in distress.    Assessment/Plan: Principal Problem:   Hypernatremia Active Problems:   Dementia (HCC)   Hypothyroidism   Stage IV pressure ulcer of sacral region [facility])   Hospice  care patient  Stage IV pressure ulcer of sacral region, present on admission Daughter reports new onset sacral pressure ulcer over the last 2 weeks, has progressed since.  Now with foul odor.  On examination, there appears to be very hard surface, likely bony extension with osteomyelitis. --MRI is pending. Blood cultures negative to date. MRSA negative Continue IV antibiotics IV vancomycin and Zosyn. Appreciate wound care recommendations.  Hypovolemic hypernatremia likely secondary to dehydration from poor oral intake Presented with a sodium level of 155 Started on D5W with improvement sodium level down to 149 Reduce rate to 75 cc/h and continue to monitor with serum sodium every 6 hours  Advanced dementia Currently on home hospice with [facility].  --Palliative care consult for assistance with goals of care and medical decision making --Overall poor prognosis  Hypothyroidism TSH low at 0.025 Ordered free T4 Synthroid dose reduced Switched to IV since she is not taking oral pills  Dysphagia Speech therapist to assess Aspiration precautions  Goals of care Palliative care team has been consulted to establish goals of care Patient is currently DNR, came from home with hospice care.  DVT prophylaxis: Lovenox subcu daily Code Status: DNR Family Communication:  None at bedside.     Consults called: Palliative care consult placed via epic   Disposition Plan:  Patient is on home with hospice care.  Anticipate discharge to home with hospice care likely tomorrow 03/29/2019 will attach to her dc summary recommendations from wound care specialist.  Barrier for discharge hypernatremia which should be improved by tomorrow.    Objective: Vitals:  03/27/19 2241 03/28/19 0438 03/28/19 0711 03/28/19 1032  BP: 106/65 120/71 (!) 145/70 126/74  Pulse: 93 90 84 97  Resp: 17 17 18 16   Temp: 98.1 F (36.7 C) 98.1 F (36.7 C) 97.9 F (36.6 C) 97.9 F (36.6 C)  TempSrc: Axillary  Axillary Axillary Oral  SpO2: 95% 98% 98% 98%  Weight:  50.3 kg    Height:        Intake/Output Summary (Last 24 hours) at 03/28/2019 1153 Last data filed at 03/28/2019 0557 Gross per 24 hour  Intake 1047.61 ml  Output 150 ml  Net 897.61 ml   Filed Weights   03/27/19 1909 03/28/19 0438  Weight: 48.5 kg 50.3 kg    Exam:  . General: 86 y.o. year-old female frail-appearing somnolent but easily arousable to voices.  Minimally interactive in the setting of advanced dementia. . Cardiovascular: Regular rate and rhythm with no rubs or gallops. Marland Kitchen Respiratory: Clear to auscultation with no wheezes or rales. Good inspiratory effort. . Abdomen: Soft nontender nondistended with normal bowel sounds x4 quadrants. . Musculoskeletal: No lower extremity edema. 2/4 pulses in all 4 extremities. Marland Kitchen Psychiatry: Mood is appropriate for condition and setting   Data Reviewed: CBC: Recent Labs  Lab 03/27/19 1512 03/28/19 0611  WBC 10.8* 6.3  NEUTROABS 9.3*  --   HGB 14.3 13.2  HCT 48.6* 42.4  MCV 99.2 95.5  PLT 166 950*   Basic Metabolic Panel: Recent Labs  Lab 03/27/19 1512 03/28/19 0611  NA 155* 149*  K 3.9 3.6  CL 115* 112*  CO2 28 28  GLUCOSE 118* 146*  BUN 31* 29*  CREATININE 0.97 0.92  CALCIUM 9.0 8.6*  MG  --  2.1   GFR: Estimated Creatinine Clearance: 23.9 mL/min (by C-G formula based on SCr of 0.92 mg/dL). Liver Function Tests: Recent Labs  Lab 03/28/19 0611  AST 22  ALT 15  ALKPHOS 69  BILITOT 0.6  PROT 5.9*  ALBUMIN 2.4*   No results for input(s): LIPASE, AMYLASE in the last 168 hours. No results for input(s): AMMONIA in the last 168 hours. Coagulation Profile: No results for input(s): INR, PROTIME in the last 168 hours. Cardiac Enzymes: No results for input(s): CKTOTAL, CKMB, CKMBINDEX, TROPONINI in the last 168 hours. BNP (last 3 results) No results for input(s): PROBNP in the last 8760 hours. HbA1C: No results for input(s): HGBA1C in the last 72  hours. CBG: No results for input(s): GLUCAP in the last 168 hours. Lipid Profile: No results for input(s): CHOL, HDL, LDLCALC, TRIG, CHOLHDL, LDLDIRECT in the last 72 hours. Thyroid Function Tests: Recent Labs    03/27/19 1512  TSH 0.025*  FREET4 1.99*   Anemia Panel: No results for input(s): VITAMINB12, FOLATE, FERRITIN, TIBC, IRON, RETICCTPCT in the last 72 hours. Urine analysis:    Component Value Date/Time   COLORURINE YELLOW 03/27/2019 1513   APPEARANCEUR CLEAR 03/27/2019 1513   APPEARANCEUR Cloudy (A) 06/03/2012 1618   LABSPEC 1.021 03/27/2019 1513   PHURINE 5.0 03/27/2019 1513   GLUCOSEU NEGATIVE 03/27/2019 1513   HGBUR SMALL (A) 03/27/2019 1513   BILIRUBINUR NEGATIVE 03/27/2019 1513   BILIRUBINUR Negative 06/03/2012 1618   KETONESUR 5 (A) 03/27/2019 1513   PROTEINUR 30 (A) 03/27/2019 1513   NITRITE NEGATIVE 03/27/2019 1513   LEUKOCYTESUR NEGATIVE 03/27/2019 1513   Sepsis Labs: @LABRCNTIP (procalcitonin:4,lacticidven:4)  ) Recent Results (from the past 240 hour(s))  Blood culture (routine x 2)     Status: None (Preliminary result)   Collection Time: 03/27/19  3:12 PM   Specimen: BLOOD  Result Value Ref Range Status   Specimen Description   Final    BLOOD BLOOD LEFT WRIST Performed at [facility] [address] [place], [place] [address]    Special Requests   Final    BOTTLES DRAWN AEROBIC AND ANAEROBIC Blood Culture results may not be optimal due to an inadequate volume of blood received in culture bottles Performed at [facility] [address] [place], [place] [address]    Culture   Final    NO GROWTH < 24 HOURS Performed at [facility] [address] [place], [place] [address]    Report Status PENDING  Incomplete  Blood culture (routine x 2)     Status: None (Preliminary result)   Collection Time: 03/27/19  3:15 PM   Specimen: BLOOD LEFT FOREARM  Result Value Ref Range Status   Specimen Description    Final    BLOOD LEFT FOREARM Performed at [facility] [address] [place], [place] [address]    Special Requests   Final    BOTTLES DRAWN AEROBIC AND ANAEROBIC Blood Culture adequate volume Performed at [facility] [address] [place], [place] [address]    Culture   Final    NO GROWTH < 24 HOURS Performed at [facility] [address] [place], [place] [address]    Report Status PENDING  Incomplete  SARS CORONAVIRUS 2 (TAT 6-24 HRS) Nasopharyngeal Nasopharyngeal Swab     Status: None   Collection Time: 03/27/19  6:02 PM   Specimen: Nasopharyngeal Swab  Result Value Ref Range Status   SARS Coronavirus 2 NEGATIVE NEGATIVE Final    Comment: (NOTE) SARS-CoV-2 target nucleic acids are NOT DETECTED. The SARS-CoV-2 RNA is generally detectable in upper and lower respiratory specimens during the acute phase of infection. Negative results do not preclude SARS-CoV-2 infection, do not rule out co-infections with other pathogens, and should not be used as the sole basis for treatment or other patient management decisions. Negative results must be combined with clinical observations, patient history, and epidemiological information. The expected result is Negative. Fact Sheet for Patients: SugarRoll.be Fact Sheet for Healthcare Providers: https://www.woods-mathews.com/ This test is not yet approved or cleared by the [place] FDA and  has been authorized for detection and/or diagnosis of SARS-CoV-2 by FDA under an Emergency Use Authorization (EUA). This EUA will remain  in effect (meaning this test can be used) for the duration of the COVID-19 declaration under Section 56 4(b)(1) of the Act, 21 U.S.C. section 360bbb-3(b)(1), unless the authorization is terminated or revoked sooner. Performed at [facility], [place] [address], [place], [place] [address]   MRSA PCR Screening     Status:  None   Collection Time: 03/28/19  4:11 AM   Specimen: Nasal Mucosa; Nasopharyngeal  Result Value Ref Range Status   MRSA by PCR NEGATIVE NEGATIVE Final    Comment:        The GeneXpert MRSA Assay (FDA approved for NASAL specimens only), is one component of a comprehensive MRSA colonization surveillance program. It is not intended to diagnose MRSA infection nor to guide or monitor treatment for MRSA infections. Performed at [facility], [place] [address], [place], [place] [address]       Studies: No results found.  Scheduled Meds: . collagenase   Topical Daily  . enoxaparin (LOVENOX) injection  40 mg Subcutaneous Q24H  .  latanoprost  1 drop Both Eyes Daily  . levothyroxine  75 mcg Intravenous Daily  .  morphine injection  1 mg Intravenous NOW    Continuous Infusions: . dextrose 75 mL/hr at 03/28/19 0557  . piperacillin-tazobactam    . piperacillin-tazobactam (ZOSYN)  IV 3.375 g (03/28/19 0410)  . [START ON 03/29/2019] vancomycin       LOS: 1 day     Kayleen Memos, MD [facility] Pager [phone]  If 7PM-7AM, please contact night-coverage [email or web address] Password [facility] 03/28/2019, 11:53 AM

## 2019-03-29 DIAGNOSIS — Z515 Encounter for palliative care: Secondary | ICD-10-CM

## 2019-03-29 DIAGNOSIS — E870 Hyperosmolality and hypernatremia: Principal | ICD-10-CM

## 2019-03-29 LAB — SODIUM
Sodium: 146 mmol/L — ABNORMAL HIGH (ref 135–145)
Sodium: 142 mmol/L (ref 135–145)

## 2019-03-29 LAB — CREATININE, SERUM
Creatinine, Ser: 0.82 mg/dL (ref 0.44–1.00)
GFR calc Af Amer: >60 mL/min (ref >60)
GFR calc non Af Amer: 59 mL/min — ABNORMAL LOW (ref >60)

## 2019-03-29 MED ORDER — ENOXAPARIN SODIUM 30 MG/0.3ML ~~LOC~~ SOLN
30.0000 mg | SUBCUTANEOUS | Status: DC
  Administered 2019-03-29: 22:00:00 30 mg via SUBCUTANEOUS
  Filled 2019-03-29: qty 0.3

## 2019-03-29 MED ORDER — ENSURE ENLIVE PO LIQD: 237.0000 mL | Freq: Two times a day (BID) | ORAL | Status: DC

## 2019-03-29 MED ORDER — SODIUM CHLORIDE 0.9 % IV SOLN
2.0000 g | INTRAVENOUS | Status: DC
  Administered 2019-03-29 – 2019-03-30 (×2): 2 g via INTRAVENOUS
  Filled 2019-03-29 (×2): qty 2

## 2019-03-29 MED ORDER — METRONIDAZOLE IN NACL 5-0.79 MG/ML-% IV SOLN
500.0000 mg | Freq: Three times a day (TID) | INTRAVENOUS | Status: DC
  Administered 2019-03-29 – 2019-03-30 (×3): 500 mg via INTRAVENOUS
  Filled 2019-03-29 (×3): qty 100

## 2019-03-29 MED ORDER — LIP MEDEX EX OINT
TOPICAL_OINTMENT | CUTANEOUS | Status: AC
  Filled 2019-03-29: qty 7

## 2019-03-29 NOTE — Progress Notes (Signed)
[facility] Documentation  Liaison notes that pt has discharge orders for today. [facility] to provide a f/u visit to pt upon discharge.   Please dc pt via [facility] with DNR paperwork and any new rxs.   Please call [facility] with any questions.   Thank you,  Freddie Breech, RN [facility] Liaison  [phone]

## 2019-03-29 NOTE — Progress Notes (Signed)
   Vital Signs MEWS/VS Documentation      03/29/2019 1431 03/29/2019 2047 03/29/2019 2049 03/29/2019 2058   MEWS Score:  1  1  2  2    MEWS Score Color:  Green  Green  Yellow  Yellow   Resp:  14  --  16  --   Pulse:  96  --  (!) 53  88   BP:  132/75  --  100/68  --   Temp:  98.1 F (36.7 C)  --  98.2 F (36.8 C)  --   O2 Device:  Room Air  --  Room Air  --   Level of Consciousness:  --  Responds to Voice  --  --        Not an acute change. Pulse rate was inaccurate. NT  Rechecked pulse and was 88. Patient at her based line. Will continue to monitor.    Melrose Nakayama 03/29/2019,9:01 PM

## 2019-03-29 NOTE — Progress Notes (Signed)
Spoke with the patient's daughter Lorrene Reid.  She would like to continue treatment with IV antibiotics and blood draws.  She states she will visit her mother tonight and decide whether or not to transition back to hospice care.  DC cancelled.  Palliative care team has been consulted to assist with establishing goals of care.

## 2019-03-29 NOTE — Evaluation (Signed)
Clinical/Bedside Swallow Evaluation Patient Details  Name: Jill Gutierrez MRN: [id number] Date of Birth: 01/22/1918  Today's Date: 03/29/2019 Time: SLP Start Time (ACUTE ONLY): 7939 SLP Stop Time (ACUTE ONLY): 1750 SLP Time Calculation (min) (ACUTE ONLY): 25 min  Past Medical History:  Past Medical History:  Diagnosis Date  . Alzheimer's disease ([place])   . Benign neoplasm of colon   . Cerebrovascular disease   . Dementia ([place])   . Hemorrhoids   . Hypertension   . Loss of weight   . Malignant neoplasm of thyroid gland ([place])   . Malignant neoplasm of thyroid gland ([place])   . Memory loss   . Osteoporosis, unspecified   . Protein calorie malnutrition ([place])   . Reflux esophagitis   . Routine general medical examination at a health care facility   . Screening for lipoid disorders   . Thyroid disease   . Unspecified cataract   . Unspecified constipation   . Unspecified essential hypertension   . Unspecified glaucoma(365.9)   . Unspecified glaucoma(365.9)   . Unspecified hypothyroidism 06/03/2012  . Urge incontinence    Past Surgical History:  Past Surgical History:  Procedure Laterality Date  . EYE SURGERY    . hemorrhoidectomy    . NO PAST SURGERIES    . THYROIDECTOMY     HPI:  Patient is a 86 y.o. female with PMH: advanced dementia, glaucoma, hypothyroidism currently on hospice care at home, who was brought to ED with cocnerns for new sacral ulcer.   Assessment / Plan / Recommendation Clinical Impression  Patient presents with what appears to be a cognitive-based dysphagia in presence of advanced dementia and is likely indicating progression towards end of life. Patient accepted sips of thin liquids (ensure shake) that daughter administered. Patient exhibited decreased awareness to boluses, delayed oral transit and delayed swallow initiation, but did not exhibit any overt s/s of aspiration or penetration. SLP Visit Diagnosis: Dysphagia, unspecified (R13.10)    Aspiration Risk  Moderate aspiration risk    Diet Recommendation Thin liquid;Dysphagia 1 (Puree)   Liquid Administration via: Cup;Straw;Spoon Medication Administration: Crushed with puree Supervision: Full supervision/cueing for compensatory strategies;Staff to assist with self feeding Compensations: Minimize environmental distractions;Slow rate;Small sips/bites    Other  Recommendations Oral Care Recommendations: Oral care QID;Staff/trained caregiver to provide oral care   Follow up Recommendations Other (comment)(hospice SLP)      Frequency and Duration min 1 x/week  1 week       Prognosis Prognosis for Safe Diet Advancement: Guarded Barriers to Reach Goals: Severity of deficits;Time post onset;Other (Comment)(advanced dementia)      Swallow Study   General Date of Onset: 03/27/19 HPI: Patient is a 86 y.o. female with PMH: advanced dementia, glaucoma, hypothyroidism currently on hospice care at home, who was brought to ED with cocnerns for new sacral ulcer. Type of Study: Bedside Swallow Evaluation Previous Swallow Assessment: N/A Diet Prior to this Study: Dysphagia 1 (puree);Thin liquids Temperature Spikes Noted: No Respiratory Status: Room air History of Recent Intubation: No Behavior/Cognition: Alert;Requires cueing;Doesn't follow directions;Distractible Oral Cavity Assessment: Other (comment)(unable to visualize secondary to patient not allowing for oral motor exam) Oral Care Completed by SLP: No Oral Cavity - Dentition: Edentulous Vision: Impaired for self-feeding Self-Feeding Abilities: Total assist Patient Positioning: Partially reclined Baseline Vocal Quality: Other (comment)(limited vocalizations observed when in pain) Volitional Cough: Cognitively unable to elicit Volitional Swallow: Unable to elicit    Oral/Motor/Sensory Function Overall Oral Motor/Sensory Function: Within functional limits   Ice Chips  Thin Liquid Thin Liquid: Impaired Presentation: Cup Oral Phase  Impairments: Reduced labial seal;Poor awareness of bolus Oral Phase Functional Implications: Prolonged oral transit Pharyngeal  Phase Impairments: Suspected delayed Swallow    Nectar Thick     Honey Thick     Puree Puree: Not tested   Solid     Solid: Not tested      Nadara Mode Tarrell 03/29/2019,6:20 PM  Sonia Baller, MA, CCC-SLP Speech Therapy [facility]

## 2019-03-29 NOTE — Progress Notes (Signed)
PROGRESS NOTE  Jill Gutierrez Jill Gutierrez:[id number] DOB: Feb 26, 1918 DOA: 03/27/2019 PCP: Patient, No Pcp Per  HPI/Recap of past 24 hours: Jill Gutierrez is a 86 y.o. female with medical history significant of advanced dementia, glaucoma, hypothyroidism currently on home hospice with for care was brought in by daughter for concerns of a new sacral ulcer.  Patient with advanced dementia, currently nonverbal and HPI obtained from daughter Jill Gutierrez.  Apparently, sacral ulcer has now been present for over the last 2 weeks.  She was concerned that this wound was not getting appropriate care by hospice services, which prompted her to bring her into the emergency department today.  She reports that the wound has progressed, and now foul-smelling.  She wishes for as much aggressive care as possible; but she understands any surgical intervention would be unwarranted given her advanced dementia, age and frailty.  Additionally, patient reports that her mother has not eaten or had much fluid intake over the past 3 to 4 days, and no bowel movements during this time as well.  ED Course: Temperature 98.6, HR 98, RR 17, BP 123/76, SPO2 100% on room air.  WBC count 10.8, hemoglobin 14.3, platelets 166.  Sodium 155, potassium 3.9, chloride 115, CO2 28, BUN 31, creatinine 0.97, glucose 118.  CRP 16.5.  Urinalysis unremarkable.  X-rays right hand, left knee, right knee, right wrist with no acute fracture/dislocation/subluxation.  ED physician ordered IV vancomycin and Zosyn.  MRI pelvis pending to evaluate extent of ulcer.  ED physician referred for admission given family's request for aggressive measures to include IV antibiotics and local wound care.  03/29/19: Seen and examined.  Minimal interaction.  Updated her daughter who wants to continue current treatment.  Palliative care team has been consulted to assist with establishing goals of care.  Assessment/Plan: Principal Problem:   Hypernatremia Active Problems:   Dementia  (HCC)   Hypothyroidism   Stage IV pressure ulcer of sacral region [employer])   Hospice care patient  Stage IV pressure ulcer of sacral region, present on admission Daughter reports new onset sacral pressure ulcer over the last 2 weeks, has progressed since.  Now with foul odor.  On examination, there appears to be very hard surface, likely bony extension with osteomyelitis. --MRI is declined by family.  No clear evidence of osteomyelitis with mildly elevated ESR of 26 and no MRI. Blood cultures negative to date. MRSA negative Continue broad-spectrum IV antibiotics for now.  Resolved hypovolemic hypernatremia likely secondary to dehydration from poor oral intake Presented with a sodium level of 155 on 03/27/2019. Sodium level 142 on 03/29/2019. Cut down rate of D5W and continue at 50 cc/h. Encourage oral intake  Dysphagia 1 Per patient's daughter she is on pured diet at home Encourage oral intake with feeding assistance Continue aspiration precautions  Advanced dementia Currently on home hospice with [facility] care.  Palliative care team has been consulted to establish goals of care. --Overall poor prognosis  Failure to thrive Patient has refused oral intake Continue to encourage oral intake Dietary consult Feeding assistance  Hypothyroidism TSH low at 0.025 Free T4 elevated Decreased dose of Synthroid. Switched to IV since she is not taking oral pills  Goals of care Palliative care team has been consulted to establish goals of care Patient is currently DNR, came from home with hospice care.  DVT prophylaxis: Lovenox subcu daily Code Status: DNR Family Communication:  Updated her daughter via phone..     Consults called: Palliative care   Disposition Plan:  Patient is on home with hospice care.  Anticipate discharge to home with hospice care likely tomorrow 03/30/2019 will attach to her dc summary recommendations from wound care specialist.  Barrier for discharge family  wanting full treatment on this hospice patient with poor prognosis.   Objective: Vitals:   03/28/19 1032 03/28/19 1415 03/28/19 2043 03/29/19 0544  BP: 126/74 114/76 133/88 (!) 133/91  Pulse: 97 91 75 92  Resp: _0 Temp: 97.9 F (36.6 C) 97.8 F (36.6 C) (!) 97.4 F (36.3 C) 98.8 F (37.1 C)  TempSrc: Oral Oral Axillary Oral  SpO2: 98% 98% 96% 100%  Weight:      Height:       No intake or output data in the 24 hours ending 03/29/19 1313 Filed Weights   03/27/19 1909 03/28/19 0438  Weight: 48.5 kg 50.3 kg    Exam:  . General: 86 y.o. year-old female frail-appearing no acute distress.  Somnolent minimally interactive.   . Cardiovascular: Regular rate and rhythm no rubs or gallops. Marland Kitchen Respiratory: Clear to auscultation with no wheezes or rales.  Good inspiratory effort. . Abdomen: Soft nontender hypoactive bowel sounds present.   . Musculoskeletal: No lower extremity edema bilaterally. Marland Kitchen Psychiatry: Unable to assess mood due to somnolence.   Data Reviewed: CBC: Recent Labs  Lab 03/27/19 1512 03/28/19 0611  WBC 10.8* 6.3  NEUTROABS 9.3*  --   HGB 14.3 13.2  HCT 48.6* 42.4  MCV 99.2 95.5  PLT 166 193*   Basic Metabolic Panel: Recent Labs  Lab 03/27/19 1512 03/27/19 1512 03/28/19 0611 03/28/19 1139 03/28/19 1848 03/28/19 2312 03/29/19 0540  NA 155*   < > 149* 148* 149* 146* 142  K 3.9  --  3.6  --   --   --   --   CL 115*  --  112*  --   --   --   --   CO2 28  --  28  --   --   --   --   GLUCOSE 118*  --  146*  --   --   --   --   BUN 31*  --  29*  --   --   --   --   CREATININE 0.97  --  0.92  --   --   --  0.82  CALCIUM 9.0  --  8.6*  --   --   --   --   MG  --   --  2.1  --   --   --   --    < > = values in this interval not displayed.   GFR: Estimated Creatinine Clearance: 26.9 mL/min (by C-G formula based on SCr of 0.82 mg/dL). Liver Function Tests: Recent Labs  Lab 03/28/19 0611  AST 22  ALT 15  ALKPHOS 69  BILITOT 0.6  PROT 5.9*    ALBUMIN 2.4*   No results for input(s): LIPASE, AMYLASE in the last 168 hours. No results for input(s): AMMONIA in the last 168 hours. Coagulation Profile: No results for input(s): INR, PROTIME in the last 168 hours. Cardiac Enzymes: No results for input(s): CKTOTAL, CKMB, CKMBINDEX, TROPONINI in the last 168 hours. BNP (last 3 results) No results for input(s): PROBNP in the last 8760 hours. HbA1C: No results for input(s): HGBA1C in the last 72 hours. CBG: No results for input(s): GLUCAP in the last 168 hours. Lipid Profile: No results for input(s): CHOL, HDL, LDLCALC, TRIG,  CHOLHDL, LDLDIRECT in the last 72 hours. Thyroid Function Tests: Recent Labs    03/27/19 1512  TSH 0.025*  FREET4 1.99*   Anemia Panel: No results for input(s): VITAMINB12, FOLATE, FERRITIN, TIBC, IRON, RETICCTPCT in the last 72 hours. Urine analysis:    Component Value Date/Time   COLORURINE YELLOW 03/27/2019 1513   APPEARANCEUR CLEAR 03/27/2019 1513   APPEARANCEUR Cloudy (A) 06/03/2012 1618   LABSPEC 1.021 03/27/2019 1513   PHURINE 5.0 03/27/2019 1513   GLUCOSEU NEGATIVE 03/27/2019 1513   HGBUR SMALL (A) 03/27/2019 1513   BILIRUBINUR NEGATIVE 03/27/2019 1513   BILIRUBINUR Negative 06/03/2012 1618   KETONESUR 5 (A) 03/27/2019 1513   PROTEINUR 30 (A) 03/27/2019 1513   NITRITE NEGATIVE 03/27/2019 1513   LEUKOCYTESUR NEGATIVE 03/27/2019 1513   Sepsis Labs: _0 (procalcitonin:4,lacticidven:4)  ) Recent Results (from the past 240 hour(s))  Blood culture (routine x 2)     Status: None (Preliminary result)   Collection Time: 03/27/19  3:12 PM   Specimen: BLOOD  Result Value Ref Range Status   Specimen Description   Final    BLOOD BLOOD LEFT WRIST Performed at [facility], [place] [address], [place], [place] [address]    Special Requests   Final    BOTTLES DRAWN AEROBIC AND ANAEROBIC Blood Culture results may not be optimal due to an inadequate volume of blood received in  culture bottles Performed at [place] [address] [place], [place] [address]    Culture   Final    NO GROWTH 2 DAYS Performed at [place] [address], [place], [place] [address]    Report Status PENDING  Incomplete  Blood culture (routine x 2)     Status: None (Preliminary result)   Collection Time: 03/27/19  3:15 PM   Specimen: BLOOD LEFT FOREARM  Result Value Ref Range Status   Specimen Description   Final    BLOOD LEFT FOREARM Performed at [facility] [address] [place], [place] [address]    Special Requests   Final    BOTTLES DRAWN AEROBIC AND ANAEROBIC Blood Culture adequate volume Performed at [facility] [address] [place], [place] [address]    Culture   Final    NO GROWTH 2 DAYS Performed at [facility] [address], [place], [place] [address]    Report Status PENDING  Incomplete  SARS CORONAVIRUS 2 (TAT 6-24 HRS) Nasopharyngeal Nasopharyngeal Swab     Status: None   Collection Time: 03/27/19  6:02 PM   Specimen: Nasopharyngeal Swab  Result Value Ref Range Status   SARS Coronavirus 2 NEGATIVE NEGATIVE Final    Comment: (NOTE) SARS-CoV-2 target nucleic acids are NOT DETECTED. The SARS-CoV-2 RNA is generally detectable in upper and lower respiratory specimens during the acute phase of infection. Negative results do not preclude SARS-CoV-2 infection, do not rule out co-infections with other pathogens, and should not be used as the sole basis for treatment or other patient management decisions. Negative results must be combined with clinical observations, patient history, and epidemiological information. The expected result is Negative. Fact Sheet for Patients: SugarRoll.be Fact Sheet for Healthcare Providers: https://www.woods-mathews.com/ This test is not yet approved or cleared by the [place] FDA and  has been  authorized for detection and/or diagnosis of SARS-CoV-2 by FDA under an Emergency Use Authorization (EUA). This EUA will remain  in effect (meaning this test can be used) for the duration of the  COVID-19 declaration under Section 56 4(b)(1) of the Act, 21 U.S.C. section 360bbb-3(b)(1), unless the authorization is terminated or revoked sooner. Performed at [facility], [place] [address], [place], [place] [address]   MRSA PCR Screening     Status: None   Collection Time: 03/28/19  4:11 AM   Specimen: Nasal Mucosa; Nasopharyngeal  Result Value Ref Range Status   MRSA by PCR NEGATIVE NEGATIVE Final    Comment:        The GeneXpert MRSA Assay (FDA approved for NASAL specimens only), is one component of a comprehensive MRSA colonization surveillance program. It is not intended to diagnose MRSA infection nor to guide or monitor treatment for MRSA infections. Performed at [facility], [place] [address] [place], [place] [address]       Studies: No results found.  Scheduled Meds: . collagenase   Topical Daily  . enoxaparin (LOVENOX) injection  30 mg Subcutaneous Q24H  . latanoprost  1 drop Both Eyes Daily  . levothyroxine  75 mcg Intravenous Daily    Continuous Infusions: . cefTRIAXone (ROCEPHIN)  IV 2 g (03/29/19 1208)  . dextrose 75 mL/hr (03/28/19 1821)  . metronidazole    . vancomycin       LOS: 2 days     Kayleen Memos, MD [facility] Pager [phone]  If 7PM-7AM, please contact night-coverage [email or web address] Password [facility] 03/29/2019, 1:13 PM

## 2019-03-29 NOTE — Consult Note (Signed)
[place]  Telephone:([phone] [phone]   Name: Jill Gutierrez Date: 03/29/2019 MRN: [id number]  DOB: 1917-05-29  Patient Care Team: Patient, No Pcp Per as PCP - General (General Practice)    REASON FOR CONSULTATION: Jill Gutierrez is a 86 y.o. female with multiple medical problems including advanced dementia, glaucoma, and hypothyroidism, who was admitted to the hospital 03/27/2019 for evaluation and management of a stage IV sacral decubitus ulcer.  Patient has been followed at home by hospice.  Reportedly, patient has had poor oral intake and is bedbound at baseline.  Hospice has been managing a sacral decubitus ulcer, which had progressed and became foul-smelling.  Family requested patient be hospitalized for management.  She was started on IV antibiotics.  WOCN was consulted.  Palliative care was consulted to help address goals.  SOCIAL HISTORY:     reports that she has never smoked. She has never used smokeless tobacco. She reports that she does not drink alcohol or use drugs.   Patient is widowed.  She lives at home with her daughter.  There are several children.  Patient is followed at home by [facility] hospice.  ADVANCE DIRECTIVES:  On file  CODE STATUS: DNR  PAST MEDICAL HISTORY: Past Medical History:  Diagnosis Date  . Alzheimer's disease (Jill Gutierrez)   . Benign neoplasm of colon   . Cerebrovascular disease   . Dementia (Jill Gutierrez)   . Hemorrhoids   . Hypertension   . Loss of weight   . Malignant neoplasm of thyroid gland (Jill Gutierrez)   . Malignant neoplasm of thyroid gland (Jill Gutierrez)   . Memory loss   . Osteoporosis, unspecified   . Protein calorie malnutrition (Jill Gutierrez)   . Reflux esophagitis   . Routine general medical examination at a health care facility   . Screening for lipoid disorders   . Thyroid disease   . Unspecified cataract   . Unspecified constipation   . Unspecified essential hypertension   . Unspecified  glaucoma(365.9)   . Unspecified glaucoma(365.9)   . Unspecified hypothyroidism 06/03/2012  . Urge incontinence     PAST SURGICAL HISTORY:  Past Surgical History:  Procedure Laterality Date  . EYE SURGERY    . hemorrhoidectomy    . NO PAST SURGERIES    . THYROIDECTOMY      HEMATOLOGY/ONCOLOGY HISTORY:  Oncology History   No history exists.    ALLERGIES:  has No Known Allergies.  MEDICATIONS:  Current Facility-Administered Medications  Medication Dose Route Frequency Provider Last Rate Last Admin  . acetaminophen (TYLENOL) tablet 650 mg  650 mg Oral Q6H PRN [place] Donnamarie Poag, DO       Or  . acetaminophen (TYLENOL) suppository 650 mg  650 mg Rectal Q6H PRN [place] Eric J, DO      . bisacodyl (DULCOLAX) suppository 10 mg  10 mg Rectal Daily PRN [place] Eric J, DO      . cefTRIAXone (ROCEPHIN) 2 g in sodium chloride 0.9 % 100 mL IVPB  2 g Intravenous Q24H Irene Pap N, DO 200 mL/hr at 03/29/19 1208 2 g at 03/29/19 1208  . collagenase (SANTYL) ointment   Topical Daily Kayleen Memos, DO   Given at 03/29/19 1157  . dextrose 5 % solution   Intravenous Continuous Kayleen Memos, DO 50 mL/hr at 03/29/19 1530 New Bag at 03/29/19 1530  . enoxaparin (LOVENOX) injection 30 mg  30 mg Subcutaneous Q24H Reginia Naas, RPH      .  feeding supplement (ENSURE ENLIVE) (ENSURE ENLIVE) liquid 237 mL  237 mL Oral BID BM Hall, Carole N, DO      . latanoprost (XALATAN) 0.005 % ophthalmic solution 1 drop  1 drop Both Eyes Daily [place] Donnamarie Poag, DO   1 drop at 03/29/19 1157  . levothyroxine (SYNTHROID, LEVOTHROID) injection 75 mcg  75 mcg Intravenous Daily Irene Pap N, DO   75 mcg at 03/29/19 1155  . LORazepam (ATIVAN) tablet 0.5 mg  0.5 mg Oral Q4H PRN [place] Donnamarie Poag, DO      . metroNIDAZOLE (FLAGYL) IVPB 500 mg  500 mg Intravenous Q8H Hall, Carole N, DO 100 mL/hr at 03/29/19 1346 500 mg at 03/29/19 1346  . morphine 10 MG/5ML solution 0.5 mg  0.5 mg Oral Q2H PRN [place] Donnamarie Poag, DO      .  morphine 2 MG/ML injection 1 mg  1 mg Intravenous Q4H PRN [place] Eric J, DO      . ondansetron [facility]) tablet 4 mg  4 mg Oral Q6H PRN [place] Eric J, DO       Or  . ondansetron [facility]) injection 4 mg  4 mg Intravenous Q6H PRN [place] Eric J, DO      . polyethylene glycol (MIRALAX / GLYCOLAX) packet 17 g  17 g Oral Daily PRN [place] Eric J, DO      . vancomycin (VANCOREADY) IVPB 750 mg/150 mL  750 mg Intravenous Q48H Arlyn Dunning M, [facility]        VITAL SIGNS: BP 132/75 (BP Location: Right Arm)   Pulse 96   Temp 98.1 F (36.7 C) (Oral)   Resp 14   Ht 5' (1.524 m)   Wt 111 lb (50.3 kg)   SpO2 100%   BMI 21.68 kg/m  Filed Weights   03/27/19 1909 03/28/19 0438  Weight: 106 lb 14.8 oz (48.5 kg) 111 lb (50.3 kg)    Estimated body mass index is 21.68 kg/m as calculated from the following:   Height as of this encounter: 5' (1.524 m).   Weight as of this encounter: 111 lb (50.3 kg).  LABS: CBC:    Component Value Date/Time   WBC 6.3 03/28/2019 0611   HGB 13.2 03/28/2019 0611   HCT 42.4 03/28/2019 0611   PLT 132 (L) 03/28/2019 0611   MCV 95.5 03/28/2019 0611   NEUTROABS 9.3 (H) 03/27/2019 1512   NEUTROABS 3 03/26/2015 0000   LYMPHSABS 0.7 03/27/2019 1512   LYMPHSABS 1.0 04/23/2014 1611   MONOABS 0.8 03/27/2019 1512   EOSABS 0.0 03/27/2019 1512   EOSABS 0.1 04/23/2014 1611   BASOSABS 0.0 03/27/2019 1512   BASOSABS 0.0 04/23/2014 1611   Comprehensive Metabolic Panel:    Component Value Date/Time   NA 142 03/29/2019 0540   NA 141 03/26/2015 0000   K 3.6 03/28/2019 0611   CL 112 (H) 03/28/2019 0611   CO2 28 03/28/2019 0611   BUN 29 (H) 03/28/2019 0611   BUN 16 03/26/2015 0000   CREATININE 0.82 03/29/2019 0540   CREATININE 1.15 (H) 06/14/2017 1411   GLUCOSE 146 (H) 03/28/2019 0611   CALCIUM 8.6 (L) 03/28/2019 0611   AST 22 03/28/2019 0611   ALT 15 03/28/2019 0611   ALKPHOS 69 03/28/2019 0611   BILITOT 0.6 03/28/2019 0611   BILITOT 0.3 04/23/2014 1611   PROT 5.9  (L) 03/28/2019 0611   PROT 6.7 04/23/2014 1611   ALBUMIN 2.4 (L) 03/28/2019 0611   ALBUMIN 4.1 04/23/2014 1611    RADIOGRAPHIC  STUDIES: DG Wrist Complete Right  Result Date: 03/24/2019 CLINICAL DATA:  86 year old patient who is nonverbal. The patient's caregiver identified RIGHT hand and wrist swelling which began approximately 1 week ago. No known injury. EXAM: RIGHT WRIST - COMPLETE 3+ VIEW COMPARISON:  None. FINDINGS: Diffuse soft tissue swelling. No evidence of acute, subacute or healed fractures. Moderate narrowing of the scaphotrapezium joint space. Remaining joint spaces well-preserved. Mild osseous demineralization. IMPRESSION: 1. No acute or subacute osseous abnormality. 2. Moderate osteoarthritis involving the scaphotrapezium joint. 3. Mild osseous demineralization. Electronically Signed   By: Evangeline Dakin M.D.   On: 03/24/2019 15:05   DG Hand Complete Right  Result Date: 03/24/2019 CLINICAL DATA:  86 year old patient who is nonverbal. The patient's caregiver identified RIGHT hand and wrist swelling which began approximately 1 week ago. No known injury. EXAM: RIGHT HAND - COMPLETE 3+ VIEW COMPARISON:  None. FINDINGS: Mild soft tissue swelling involving the proximal hand. No evidence of acute or subacute fracture or dislocation. Narrowing of the IP joints of all the fingers and thumb. MTP joint spaces well-preserved. Mild osseous demineralization. IMPRESSION: 1. No acute or subacute osseous abnormality. 2. Osteoarthritis involving the IP joints of the fingers and thumb. 3. Mild osseous demineralization. Electronically Signed   By: Evangeline Dakin M.D.   On: 03/24/2019 15:07    PERFORMANCE STATUS (ECOG) : 4 - Bedbound  Review of Systems Unable to provide  Physical Exam General: NAD, frail appearing, thin Pulmonary: Unlabored Extremities: no edema, no joint deformities Skin: Wound noted but not visualized Neurological: Patient opens eyes to stimulation but is nonverbal.  She  does not follow commands.  IMPRESSION: Patient is confused and nonverbal at baseline.  She is comfortable appearing at present.  No family in room.  I called and spoke with daughter, [name].  Patient lives at home with [name].  She says that patient has been having progressive decline in her oral intake over the past days to weeks.  Daughter requested ER evaluation when wound became foul-smelling.  We discussed WOCN recommendations.    Daughter verbalized understanding that patient is very likely nearing end-of-life.  She recognizes that patient's oral intake is minimal and likely not sufficient to sustain long-term.  She verbalized an understanding that patient's nutritional status is likely such that patient's wound would never heal.  We discussed palliative wound care.  I suggested as needed dressing changes.  Consideration could be given for topical metronidazole to manage odor. Systemic antibiotics would be unlikely to provide significant benefit at this stage.    Daughter stated that she would be comfortable with patient returning home with hospice tomorrow.  I did call and speak with the hospice liaison.  PLAN: -Best supportive care -Daughter requested discharge tomorrow with hospice if possible -Recommend palliative wound care with as needed dressing changes to manage drainage/odor -Could consider topical metronidazole for management of odor -DNR/DNI  Time Total: 60 minutes  Visit consisted of counseling and education dealing with the complex and emotionally intense issues of symptom management and palliative care in the setting of serious and potentially life-threatening illness.Greater than 50%  of this time was spent counseling and coordinating care related to the above assessment and plan.  Signed by: Altha Harm, PhD, NP-C

## 2019-03-29 NOTE — Progress Notes (Signed)
Manufacturing engineer [facility]) Hospital Liaison GIP RN note.   This is a related and covered [facility] GIP admission of 03/27/2019 with an [place] diagnosis of  Cerebrovascular atherosclerosis. Patient is DNR.  Patient family activated EMS to transport patient to the ED to evaluate worsening sacral ulcer. Patient was admitted for wound of sacral region and treatment of. Daughter wishes for as much aggressive care as possible; but she understands any surgical intervention would be unwarranted given her advanced dementia, age and frailty.   Patient is minimally interactive, opens eyes to voice, does not appear in distress. No PO intake, receiving IV fluids for hydration.  03/29/19: Seen and examined.  Minimal interaction.  Updated her daughter who wants to continue current treatment.  Palliative care team has been consulted to assist with establishing goals of care. Patient is on home with hospice care.  Anticipate discharge to home with hospice care likely tomorrow 03/30/2019 will attach to her dc summary recommendations from wound care specialist.  Barrier for discharge family wanting full treatment on this hospice patient with poor prognosis. Pt was to dc today but per MD note, "Spoke with the patient's daughter Lorrene Reid.  She would like to continue treatment with IV antibiotics and blood draws.  She states she will visit her mother tonight and decide whether or not to transition back to hospice care. DC cancelled.  Palliative care team has been consulted to assist with establishing goals of care".   VS:  BP: 126/74 114/76 133/88 (!) 133/91 Pulse: 97 91 75 92 Resp: _0 Temp: 97.9 F (36.6 C) 97.8 F (36.6 C) (!) 97.4 F (36.3 C) 98.8 F (37.1 C) SpO2: 98% 98% 96% 100%   Abnormal labs: NA 142, Chloride 112, Glucose 146, BUN29, calcium 8.6, Albumin 2.4, Total protein 5.9, GFR 59, Platelets 146   Medications: Continuous- D5_1 /hr, Zosyn IVPB 3.375g TID,  Vancomycin IVBP 57mg QOD.   Per MD  notes: Assessment/Plan: Principal Problem:   Hypernatremia Active Problems:   [place] ([place]   Hypothyroidism   Stage IV pressure ulcer of sacral region ([facility]   Hospice care patient   Stage IV pressure ulcer of sacral region, present on admission Daughter reports new onset sacral pressure ulcer over the last 2 weeks, has progressed since.  Now with foul odor.  On examination, there appears to be very hard surface, likely bony extension with osteomyelitis. --MRI is declined by family.  No clear evidence of osteomyelitis with mildly elevated ESR of 26 and no MRI. Blood cultures negative to date. MRSA negative Continue broad-spectrum IV antibiotics for now.   Resolved hypovolemic hypernatremia likely secondary to dehydration from poor oral intake Presented with a sodium level of 155 on 03/27/2019. Sodium level 142 on 03/29/2019. Cut down rate of D5W and continue at 50 cc/h. Encourage oral intake   Dysphagia 1 Per patient's daughter she is on pured diet at home Encourage oral intake with feeding assistance Continue aspiration precautions   Advanced dementia Currently on home hospice with [facility].  Palliative care team has been consulted to establish goals of care. --Overall poor prognosis   Failure to thrive Patient has refused oral intake Continue to encourage oral intake Dietary consult Feeding assistance   Hypothyroidism TSH low at 0.025 Free T4 elevated Decreased dose of Synthroid. Switched to IV since she is not taking oral pills   Goals of care Palliative care team has been consulted to establish goals of care Patient is currently DNR, came from home with hospice  care.   Goals of Care: Patient is DNR according to hospital. Spoke with daughter Jill Gutierrez who stated that the patient had not been eating for last 4 days and has notable decline over last few weeks. Writer attempted to educate on EOL and Jill Gutierrez stated that pt has had a decline like this a few times in the  past and "always bounces back". Writer noted that Jill Gutierrez mentioned to MD that they need to decide if pt will continue on hospice services and she stated, "We just don't know. We don't think she is dying". Writer educated that with the pt's labs, skin breakdown, lack of po intake at home, pt does appear to be at EOL. Daughter thanked Probation officer for call and agreed that she will talk with her siblings to determine best dc plan for them.    IDG: team updated.    Communication with PCG: Spoke with Jill Gutierrez, daughter and PCG as noted above.    Please use [facility] for ambulance transport for all active [facility] patients that require ambulance transport.   Please call with any hospice related questions.  Freddie Breech, RN [facility] Liaison [phone]

## 2019-03-30 LAB — CBC
HCT: 44.2 % (ref 36.0–46.0)
Hemoglobin: 13.5 g/dL (ref 12.0–15.0)
MCH: 29.3 pg (ref 26.0–34.0)
MCHC: 30.5 g/dL (ref 30.0–36.0)
MCV: 95.9 fL (ref 80.0–100.0)
Platelets: 191 10*3/uL (ref 150–400)
RBC: 4.61 MIL/uL (ref 3.87–5.11)
RDW: 12.5 % (ref 11.5–15.5)
WBC: 6.2 10*3/uL (ref 4.0–10.5)
nRBC: 0 % (ref 0.0–0.2)

## 2019-03-30 LAB — BASIC METABOLIC PANEL
Anion gap: 10 (ref 5–15)
BUN: 19 mg/dL (ref 8–23)
CO2: 25 mmol/L (ref 22–32)
Calcium: 8.6 mg/dL — ABNORMAL LOW (ref 8.9–10.3)
Chloride: 108 mmol/L (ref 98–111)
Creatinine, Ser: 0.8 mg/dL (ref 0.44–1.00)
GFR calc Af Amer: >60 mL/min (ref >60)
GFR calc non Af Amer: >60 mL/min (ref >60)
Glucose, Bld: 117 mg/dL — ABNORMAL HIGH (ref 70–99)
Potassium: 2.7 mmol/L — CL (ref 3.5–5.1)
Sodium: 143 mmol/L (ref 135–145)

## 2019-03-30 LAB — POTASSIUM: Potassium: 2.9 mmol/L — ABNORMAL LOW (ref 3.5–5.1)

## 2019-03-30 MED ORDER — METRONIDAZOLE 0.75 % EX GEL: Freq: Two times a day (BID) | CUTANEOUS | 0 refills | Status: AC

## 2019-03-30 MED ORDER — POTASSIUM CHLORIDE CRYS ER 20 MEQ PO TBCR
40.0000 meq | EXTENDED_RELEASE_TABLET | Freq: Once | ORAL | Status: AC
  Administered 2019-03-30: 12:00:00 40 meq via ORAL
  Filled 2019-03-30: qty 2

## 2019-03-30 MED ORDER — POTASSIUM CHLORIDE CRYS ER 20 MEQ PO TBCR: 40.0000 meq | EXTENDED_RELEASE_TABLET | Freq: Every day | ORAL | 0 refills | Status: AC

## 2019-03-30 MED ORDER — POTASSIUM CHLORIDE 10 MEQ/100ML IV SOLN: 10.0000 meq | INTRAVENOUS | Status: DC

## 2019-03-30 MED ORDER — COLLAGENASE 250 UNIT/GM EX OINT: TOPICAL_OINTMENT | Freq: Every day | CUTANEOUS | 0 refills | Status: AC

## 2019-03-30 MED ORDER — POTASSIUM CHLORIDE 10 MEQ/100ML IV SOLN
10.0000 meq | INTRAVENOUS | Status: DC
  Administered 2019-03-30: 11:00:00 10 meq via INTRAVENOUS
  Filled 2019-03-30: qty 100

## 2019-03-30 MED ORDER — METRONIDAZOLE 0.75 % EX GEL
Freq: Two times a day (BID) | CUTANEOUS | Status: DC
  Filled 2019-03-30: qty 45

## 2019-03-30 MED ORDER — POTASSIUM CHLORIDE 10 MEQ/100ML IV SOLN
10.0000 meq | INTRAVENOUS | Status: DC
  Filled 2019-03-30: qty 100

## 2019-03-30 MED ORDER — COLLAGENASE 250 UNIT/GM EX OINT: TOPICAL_OINTMENT | Freq: Every day | CUTANEOUS | 0 refills | Status: DC

## 2019-03-30 MED ORDER — POTASSIUM CHLORIDE 10 MEQ/100ML IV SOLN
10.0000 meq | INTRAVENOUS | Status: DC
  Administered 2019-03-30: 12:00:00 10 meq via INTRAVENOUS

## 2019-03-30 NOTE — TOC Initial Note (Signed)
Transition of Care [facility]) - Initial/Assessment Note    Patient Details  Name: Jill Gutierrez MRN: [id number] Date of Birth: [birth year]  Transition of Care (TOC) CM/SW Contact:    Joaquin Courts, RN Phone Number: 03/30/2019, 9:12 AM  Clinical Narrative:         Patient is active with hospice services at home and will continue services at discharge.           Expected Discharge Plan: Home w Hospice Care Barriers to Discharge: No Barriers Identified   Patient Goals and CMS Choice Patient states their goals for this hospitalization and ongoing recovery are:: to return home with hospice      Expected Discharge Plan and Services Expected Discharge Plan: [place]   Discharge Planning Services: CM Consult Post Acute Care Choice: Resumption of Svcs/PTA Provider Living arrangements for the past 2 months: Single Family Home Expected Discharge Date: 03/30/19               DME Arranged: N/A DME Agency: NA       HH Arranged: NA HH Agency: NA        Prior Living Arrangements/Services Living arrangements for the past 2 months: Single Family Home Lives with:: Adult Children Patient language and need for interpreter reviewed:: Yes Do you feel safe going back to the place where you live?: Yes      Need for Family Participation in Patient Care: Yes (Comment) Care giver support system in place?: Yes (comment)   Criminal Activity/Legal Involvement Pertinent to Current Situation/Hospitalization: No - Comment as needed  Activities of Daily Living Home Assistive Devices/Equipment: Wheelchair, Environmental consultant (specify type)(front wheeled walker) ADL Screening (condition at time of admission) Patient's cognitive ability adequate to safely complete daily activities?: No Is the patient deaf or have difficulty hearing?: Yes(slight hoh) Does the patient have difficulty seeing, even when wearing glasses/contacts?: No Does the patient have difficulty concentrating, remembering, or making  decisions?: Yes Patient able to express need for assistance with ADLs?: No Does the patient have difficulty dressing or bathing?: Yes Independently performs ADLs?: No Communication: Independent Dressing (OT): Needs assistance Is this a change from baseline?: Pre-admission baseline Grooming: Needs assistance Is this a change from baseline?: Pre-admission baseline Feeding: Needs assistance Is this a change from baseline?: Pre-admission baseline Bathing: Needs assistance Is this a change from baseline?: Pre-admission baseline Toileting: Dependent Is this a change from baseline?: Pre-admission baseline In/Out Bed: Dependent Is this a change from baseline?: Pre-admission baseline Walks in Home: Dependent(patient wheelchair bound) Is this a change from baseline?: Pre-admission baseline Does the patient have difficulty walking or climbing stairs?: Yes(secondary to weakness) Weakness of Legs: Both Weakness of Arms/Hands: Both  Permission Sought/Granted                  Emotional Assessment           Psych Involvement: No (comment)  Admission diagnosis:  Hypernatremia [E87.0] Wound of sacral region, initial encounter [S31.000A] Dementia without behavioral disturbance, unspecified dementia type ([place]) [F03.90] Cellulitis, unspecified cellulitis site [L03.90] Patient Active Problem List   Diagnosis Date Noted  . Palliative care encounter   . Hypernatremia 03/27/2019  . Stage IV pressure ulcer of sacral region ([place]) 03/27/2019  . Hospice care patient 03/27/2019  . Cutaneous fungal infection 10/29/2014  . Hemorrhoid 11/20/2013  . Dysphagia 11/20/2013  . Urinary incontinence due to cognitive impairment 06/03/2012  . Senile osteoporosis 06/03/2012  . Vascular dementia ([place]) 06/03/2012  . Hypothyroidism 06/03/2012  .  Hyperlipidemia LDL goal < 100 06/03/2012  . Dementia ([place]) 09/25/2011  . Dyslipidemia 09/25/2011   PCP:  Patient, No Pcp Per Pharmacy:   [facility]  [address] ([address] [place] [address]  ([place])  [address] Phone: [phone] Fax: [phone]     Social Determinants of Health (SDOH) Interventions    Readmission Risk Interventions No flowsheet data found.

## 2019-03-30 NOTE — Progress Notes (Signed)
Pharmacy Antibiotic Note  Jill Gutierrez is a 86 y.o. female admitted on 03/27/2019 with wound infection.  Pharmacy has been consulted for Vanc/Zosyn dosing.  Day #4 of abx.  Plan: Continue vancomycin 750mg  IV Q48 Continue ceftriaxone 2g IV Q24h and Flagyl 500mg  IV Q8h Monitor clinical picture, renal function, vanc levels prn F/U abx deescalation / LOT  May consider discharge back to hospice today F/U GOC, consider stopping IV abx per palliative recommendation?  Height: 5' (152.4 cm) Weight: 111 lb (50.3 kg) IBW/kg (Calculated) : 45.5  Temp (24hrs), Avg:98.3 F (36.8 C), Min:98.1 F (36.7 C), Max:98.4 F (36.9 C)  Recent Labs  Lab 03/27/19 1512 03/28/19 0611 03/29/19 0540 03/30/19 0519  WBC 10.8* 6.3  --  6.2  CREATININE 0.97 0.92 0.82 0.80    Estimated Creatinine Clearance: 27.5 mL/min (by C-G formula based on SCr of 0.8 mg/dL).    No Known Allergies   Thank you for allowing pharmacy to be a part of this patient's care.  Reginia Naas 03/30/2019 7:37 AM

## 2019-03-30 NOTE — Progress Notes (Signed)
[facility] Documentation  Liaison notes that pt has discharge orders for today. [facility] to provide a f/u visit to pt upon discharge.   Please dc pt via [facility] with DNR paperwork and any new rxs.   Please call [facility] with any questions.   Thank you,  Freddie Breech, RN [facility] Liaison  [phone]

## 2019-03-30 NOTE — Discharge Summary (Addendum)
Discharge Summary  Jill Gutierrez:[id number] DOB: [birth year]  PCP: Patient, No Pcp Per  Admit date: 03/27/2019 Discharge date: 03/30/2019  Time spent: 35 minutes   Recommendations for Outpatient Follow-up:  Continue hospice care.  RECOMMENDATIONS PER WOUND CARE SPECIALIST: [place] Nurse Consult Note: Reason for Consult: Consult requested for sacrum wound.  Performed remotely after review of photo and progress notes in the EMR.  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 Wound is 8X8cm, according to nursing flowsheet. Pressure Injury POA: Yes Dressing procedure/placement/frequency: Topical treatment orders provided for bedside nurses to perform daily as follows to assist with removal of nonviable tissue: Apply Santyl to sacrum wound Q day, then cover with moist gauze and ABD pad and tape. Please re-consult if further assistance is needed.  Thank-you,  [place] MSN, RN, CWOCN, CWCN-AP, CNS  RECOMMENDATIONS PER SPEECH THERAPIST:  Diet Recommendation Thin liquid;Dysphagia 1 (Puree)   Liquid Administration via: Cup;Straw;Spoon Medication Administration: Crushed with puree Supervision: Full supervision/cueing for compensatory strategies;Staff to assist with self feeding Compensations: Minimize environmental distractions;Slow rate;Small sips/bites      RECOMMENDATIONS PER PALLIATIVE CARE PROVIDER NP BORDERS:  PLAN: -Best supportive care -Daughter requested discharge tomorrow with hospice if possible -Recommend palliative wound care with as needed dressing changes to manage drainage/odor -Could consider topical metronidazole for management of odor -DNR/DNI  Discharge Diagnoses:  Active Hospital Problems   Diagnosis Date Noted  . Hypernatremia 03/27/2019  . Palliative care encounter   . Stage IV pressure ulcer of sacral region ([place]) 03/27/2019  .  Hospice care patient 03/27/2019  . Hypothyroidism 06/03/2012  . Dementia ([place]) 09/25/2011    Resolved Hospital Problems  No resolved problems to display.    Diet recommendation: Continue pleasure feedings.  She is on pured diet.  Vitals:   03/30/19 0730 03/30/19 0930  BP: 128/78 (!) 142/82  Pulse: 72 99  Resp: (!) 22 16  Temp: 98 F (36.7 C) 98.6 F (37 C)  SpO2: 95% 100%    History of present illness:  Jill A Goodeis a 86 y.o.femalewith medical history significant ofadvanced dementia, glaucoma, hypothyroidism currently on home hospice care was brought in by daughter for concerns of a new sacral ulcer. She was concerned that this wound was not getting appropriate care by hospice services, which prompted her to bring her into the emergency department. She reports that the wound has progressed, and now foul-smelling. She wishes for as much aggressive care as possible;but she understands any surgical intervention would be unwarranted given her advanced dementia, age and frailty.  Has had poor oral intake over the past 3 to 4 days.  ED Course:Temperature 98.6, HR 98, RR 17, BP 123/76, SPO2 100% on room air. WBC count 10.8, hemoglobin 14.3, platelets 166. Sodium 155, potassium 3.9, chloride 115, CO2 28, BUN 31, creatinine 0.97, glucose 118. CRP 16.5. Urinalysis unremarkable. X-rays right hand, left knee, right knee, right wrist with no acute fracture/dislocation/subluxation. ED physician started IV vancomycin and Zosyn.  Daughter declined MRI pelvis which was canceled.  During hospitalization patient had minimal interaction and minimal oral intake.  Palliative care provider discussed goals of care with patient's daughter.  Family made decision to discharge to home with hospice on 03/30/19.  03/30/19: Seen and examined.  No acute events overnight.  Minimally interactive in the setting of advanced dementia.  Patient will be discharged to home with hospice care  at the request of  her family.  Hospital Course:  Principal Problem:   Hypernatremia Active Problems:   Dementia (HCC)   Hypothyroidism   Stage IV pressure ulcer of sacral region [facility])   Hospice care patient   Palliative care encounter  Stage IV pressure ulcer of sacral region, present on admission Daughter reports new onset sacral pressure ulcer over the last 2 weeks Now with foul odor.  --MRI is declined by daughter, canceled.  Blood cultures negative to date. MRSA negative Received broad-spectrum IV antibiotics.  IV vancomycin, IV flagyl and IV Rocephin. Continue local wound care. Santyl and metronidazole gel prescribed at daughter's request. Family requests discharge to home with hospice care on 03/30/19  Hypokalemia K+ 2.7>> repeat serum potassium>> 2.9 Repleted with IV Kcl supplement 10 meq x 2 Received po Kcl 40 meq x1 dose Prescribed oral potassium supplement to be crushed in applesauce, po KCL 40 meq daily x 3 days.  Repeat serum potassium on Tuesday 04/01/2019 at PCP's office or through hospice care.  Defer further management to Hospice care.  Resolved hypovolemic hypernatremia likely secondary to dehydration from poor oral intake Presented with a sodium level of 155 on 03/27/2019. Sodium level 143 on 03/30/2019.  Received IV D5W.  Dysphagia 1 Per patient's daughter she is on pured diet at home Evaluated by speech therapy with same recommendation.  Advanced dementia Currently on home hospice [facility].   Failure to thrive Patient has refused oral intake Feeding assistance  Hypothyroidism TSH low at 0.025 Free T4 elevated 1.99 Dose of IV Synthroid was decreased.  Goals of care Palliative care team followed Patient is currently DNR, came from home with hospice care.  Family made decision to return home with hospice care on 03/30/19.   Code Status:DNR   Consults called:Palliative care      Discharge Exam: BP (!) 142/82 (BP Location: Right Arm)    Pulse 99   Temp 98.6 F (37 C) (Axillary)   Resp 16   Ht 5' (1.524 m)   Wt 50.3 kg   SpO2 100%   BMI 21.68 kg/m  . General: 86 y.o. year-old female very frail-appearing in no acute distress.  Minimally interactive in the state of advanced dementia.   . Cardiovascular: Regular rate and rhythm with no rubs or gallops.  No thyromegaly or JVD noted.   Marland Kitchen Respiratory: Clear to auscultation with no wheezes or rales. Good inspiratory effort. . Abdomen: Soft nontender nondistended with normal bowel sounds x4 quadrants. . Musculoskeletal: No lower extremity edema. 2/4 pulses in all 4 extremities. Marland Kitchen Psychiatry: Mood is appropriate for condition and setting  Discharge Instructions You were cared for by a hospitalist during your hospital stay. If you have any questions about your discharge medications or the care you received while you were in the hospital after you are discharged, you can call the unit and asked to speak with the hospitalist on call if the hospitalist that took care of you is not available. Once you are discharged, your primary care physician will handle any further medical issues. Please note that NO REFILLS for any discharge medications will be authorized once you are discharged, as it is imperative that you return to your primary care physician (or establish a relationship with a primary care physician if you do not have one) for your aftercare needs so that they can reassess your need for medications and monitor your lab values.   Allergies as of 03/30/2019   No Known Allergies  Medication List    STOP taking these medications   acetaminophen 325 MG tablet Commonly known as: TYLENOL   Euthyrox 200 MCG tablet Generic drug: levothyroxine   Glucosamine-Chondroitin 1500-1200 MG/30ML Liqd   hyoscyamine 0.125 MG tablet Commonly known as: LEVSIN   latanoprost 0.005 % ophthalmic solution Commonly known as: XALATAN   LORazepam 0.5 MG tablet Commonly known as: ATIVAN     morphine 20 MG/5ML solution   multivitamin with minerals Tabs tablet   neomycin-bacitracin-polymyxin Oint Commonly known as: NEOSPORIN   oyster calcium 500 MG Tabs tablet   polyethylene glycol powder 17 GM/SCOOP powder Commonly known as: GLYCOLAX/MIRALAX   Vitamin D3 1.25 MG (50000 UT) Caps     TAKE these medications   collagenase ointment Commonly known as: SANTYL Apply topically daily. Start taking on: March 31, 2019   metroNIDAZOLE 0.75 % gel Commonly known as: METROGEL Apply topically 2 (two) times daily.   potassium chloride SA 20 MEQ tablet Commonly known as: KLOR-CON Take 2 tablets (40 mEq total) by mouth daily for 3 days.      No Known Allergies    The results of significant diagnostics from this hospitalization (including imaging, microbiology, ancillary and laboratory) are listed below for reference.    Significant Diagnostic Studies: DG Wrist Complete Right  Result Date: 03/24/2019 CLINICAL DATA:  86 year old patient who is nonverbal. The patient's caregiver identified RIGHT hand and wrist swelling which began approximately 1 week ago. No known injury. EXAM: RIGHT WRIST - COMPLETE 3+ VIEW COMPARISON:  None. FINDINGS: Diffuse soft tissue swelling. No evidence of acute, subacute or healed fractures. Moderate narrowing of the scaphotrapezium joint space. Remaining joint spaces well-preserved. Mild osseous demineralization. IMPRESSION: 1. No acute or subacute osseous abnormality. 2. Moderate osteoarthritis involving the scaphotrapezium joint. 3. Mild osseous demineralization. Electronically Signed   By: Evangeline Dakin M.D.   On: 03/24/2019 15:05   DG Hand Complete Right  Result Date: 03/24/2019 CLINICAL DATA:  86 year old patient who is nonverbal. The patient's caregiver identified RIGHT hand and wrist swelling which began approximately 1 week ago. No known injury. EXAM: RIGHT HAND - COMPLETE 3+ VIEW COMPARISON:  None. FINDINGS: Mild soft tissue swelling  involving the proximal hand. No evidence of acute or subacute fracture or dislocation. Narrowing of the IP joints of all the fingers and thumb. MTP joint spaces well-preserved. Mild osseous demineralization. IMPRESSION: 1. No acute or subacute osseous abnormality. 2. Osteoarthritis involving the IP joints of the fingers and thumb. 3. Mild osseous demineralization. Electronically Signed   By: Evangeline Dakin M.D.   On: 03/24/2019 15:07    Microbiology: Recent Results (from the past 240 hour(s))  Blood culture (routine x 2)     Status: None (Preliminary result)   Collection Time: 03/27/19  3:12 PM   Specimen: BLOOD  Result Value Ref Range Status   Specimen Description   Final    BLOOD BLOOD LEFT WRIST Performed at [facility] [address] [place], [place] [address]    Special Requests   Final    BOTTLES DRAWN AEROBIC AND ANAEROBIC Blood Culture results may not be optimal due to an inadequate volume of blood received in culture bottles Performed at [facility] [address], [place], [place] [address]    Culture   Final    NO GROWTH 3 DAYS Performed at [facility], [place] [address] [place], [place] [address]    Report Status PENDING  Incomplete  Blood culture (routine x 2)  Status: None (Preliminary result)   Collection Time: 03/27/19  3:15 PM   Specimen: BLOOD LEFT FOREARM  Result Value Ref Range Status   Specimen Description   Final    BLOOD LEFT FOREARM Performed at [facility] [address], [place], [place] [address]    Special Requests   Final    BOTTLES DRAWN AEROBIC AND ANAEROBIC Blood Culture adequate volume Performed at [facility] [address] [place], [place] [address]    Culture   Final    NO GROWTH 3 DAYS Performed at [facility], [place] [address], [place], [place] [address]    Report Status PENDING  Incomplete  SARS CORONAVIRUS 2 (TAT 6-24 HRS) Nasopharyngeal  Nasopharyngeal Swab     Status: None   Collection Time: 03/27/19  6:02 PM   Specimen: Nasopharyngeal Swab  Result Value Ref Range Status   SARS Coronavirus 2 NEGATIVE NEGATIVE Final    Comment: (NOTE) SARS-CoV-2 target nucleic acids are NOT DETECTED. The SARS-CoV-2 RNA is generally detectable in upper and lower respiratory specimens during the acute phase of infection. Negative results do not preclude SARS-CoV-2 infection, do not rule out co-infections with other pathogens, and should not be used as the sole basis for treatment or other patient management decisions. Negative results must be combined with clinical observations, patient history, and epidemiological information. The expected result is Negative. Fact Sheet for Patients: SugarRoll.be Fact Sheet for Healthcare Providers: https://www.woods-mathews.com/ This test is not yet approved or cleared by the [place] FDA and  has been authorized for detection and/or diagnosis of SARS-CoV-2 by FDA under an Emergency Use Authorization (EUA). This EUA will remain  in effect (meaning this test can be used) for the duration of the COVID-19 declaration under Section 56 4(b)(1) of the Act, 21 U.S.C. section 360bbb-3(b)(1), unless the authorization is terminated or revoked sooner. Performed at [facility], [place] [address] [place], [place] [address]   MRSA PCR Screening     Status: None   Collection Time: 03/28/19  4:11 AM   Specimen: Nasal Mucosa; Nasopharyngeal  Result Value Ref Range Status   MRSA by PCR NEGATIVE NEGATIVE Final    Comment:        The GeneXpert MRSA Assay (FDA approved for NASAL specimens only), is one component of a comprehensive MRSA colonization surveillance program. It is not intended to diagnose MRSA infection nor to guide or monitor treatment for MRSA infections. Performed at [facility], [place] [address], [place], [place] [address]       Labs: Basic Metabolic Panel: Recent Labs  Lab 03/27/19 1512 03/27/19 1512 03/28/19 0611 03/28/19 0611 03/28/19 1139 03/28/19 1848 03/28/19 2312 03/29/19 0540 03/30/19 0519 03/30/19 1049  NA 155*   < > 149*   < > 148* 149* 146* 142 143  --   K 3.9  --  3.6  --   --   --   --   --  2.7* 2.9*  CL 115*  --  112*  --   --   --   --   --  108  --   CO2 28  --  28  --   --   --   --   --  25  --   GLUCOSE 118*  --  146*  --   --   --   --   --  117*  --   BUN 31*  --  29*  --   --   --   --   --  19  --   CREATININE 0.97  --  0.92  --   --   --   --  0.82 0.80  --   CALCIUM 9.0  --  8.6*  --   --   --   --   --  8.6*  --   MG  --   --  2.1  --   --   --   --   --   --   --    < > = values in this interval not displayed.   Liver Function Tests: Recent Labs  Lab 03/28/19 0611  AST 22  ALT 15  ALKPHOS 69  BILITOT 0.6  PROT 5.9*  ALBUMIN 2.4*   No results for input(s): LIPASE, AMYLASE in the last 168 hours. No results for input(s): AMMONIA in the last 168 hours. CBC: Recent Labs  Lab 03/27/19 1512 03/28/19 0611 03/30/19 0519  WBC 10.8* 6.3 6.2  NEUTROABS 9.3*  --   --   HGB 14.3 13.2 13.5  HCT 48.6* 42.4 44.2  MCV 99.2 95.5 95.9  PLT 166 132* 191   Cardiac Enzymes: No results for input(s): CKTOTAL, CKMB, CKMBINDEX, TROPONINI in the last 168 hours. BNP: BNP (last 3 results) No results for input(s): BNP in the last 8760 hours.  ProBNP (last 3 results) No results for input(s): PROBNP in the last 8760 hours.  CBG: No results for input(s): GLUCAP in the last 168 hours.     Signed:  Kayleen Memos, MD [facility] 03/30/2019, 1:03 PM

## 2019-03-30 NOTE — Progress Notes (Signed)
59meq of Kdur given to patient dissolved in applesauce. Pt tolerated well. No coughing noted afterwards. Will continue to monitor.

## 2019-03-30 NOTE — Progress Notes (Signed)
Updated the patient's daughter Jill Gutierrez via phone.  She requested prescription for metronidazole gel mentioned by palliative care on 03/29/19 to apply to sacrum.  Advised that Santyl was recommended by wound care specialist.  Wound Care RN recommendations attached to dc summary.    Printed script for Metronidazole gel.  All questions answered.    Ms. Jill Gutierrez states she will come to the hospital around 1400.  Advised [facility] pick up possibly around 1500.

## 2019-03-30 NOTE — Progress Notes (Signed)
Pt discharged home via [facility] in stable condition. Discharge instructions an scripts given to daughter. No immediate questions or concerns at this time.

## 2019-04-01 LAB — CULTURE, BLOOD (ROUTINE X 2)
Culture: NO GROWTH
Culture: NO GROWTH
Special Requests: ADEQUATE

## 2019-04-07 DEATH — deceased

## 2019-05-05 DEATH — deceased

## 2021-03-05 IMAGING — DX DG KNEE 1-2V*R*
2 series · 2 of 2 positions shown · non-contrast
Comparison: Head left knee radiograph dated 02/03/2019.

CLINICAL DATA: [AGE] female with fall and right knee pain

EXAM:
RIGHT KNEE - 1-2 VIEW

[knee ap]
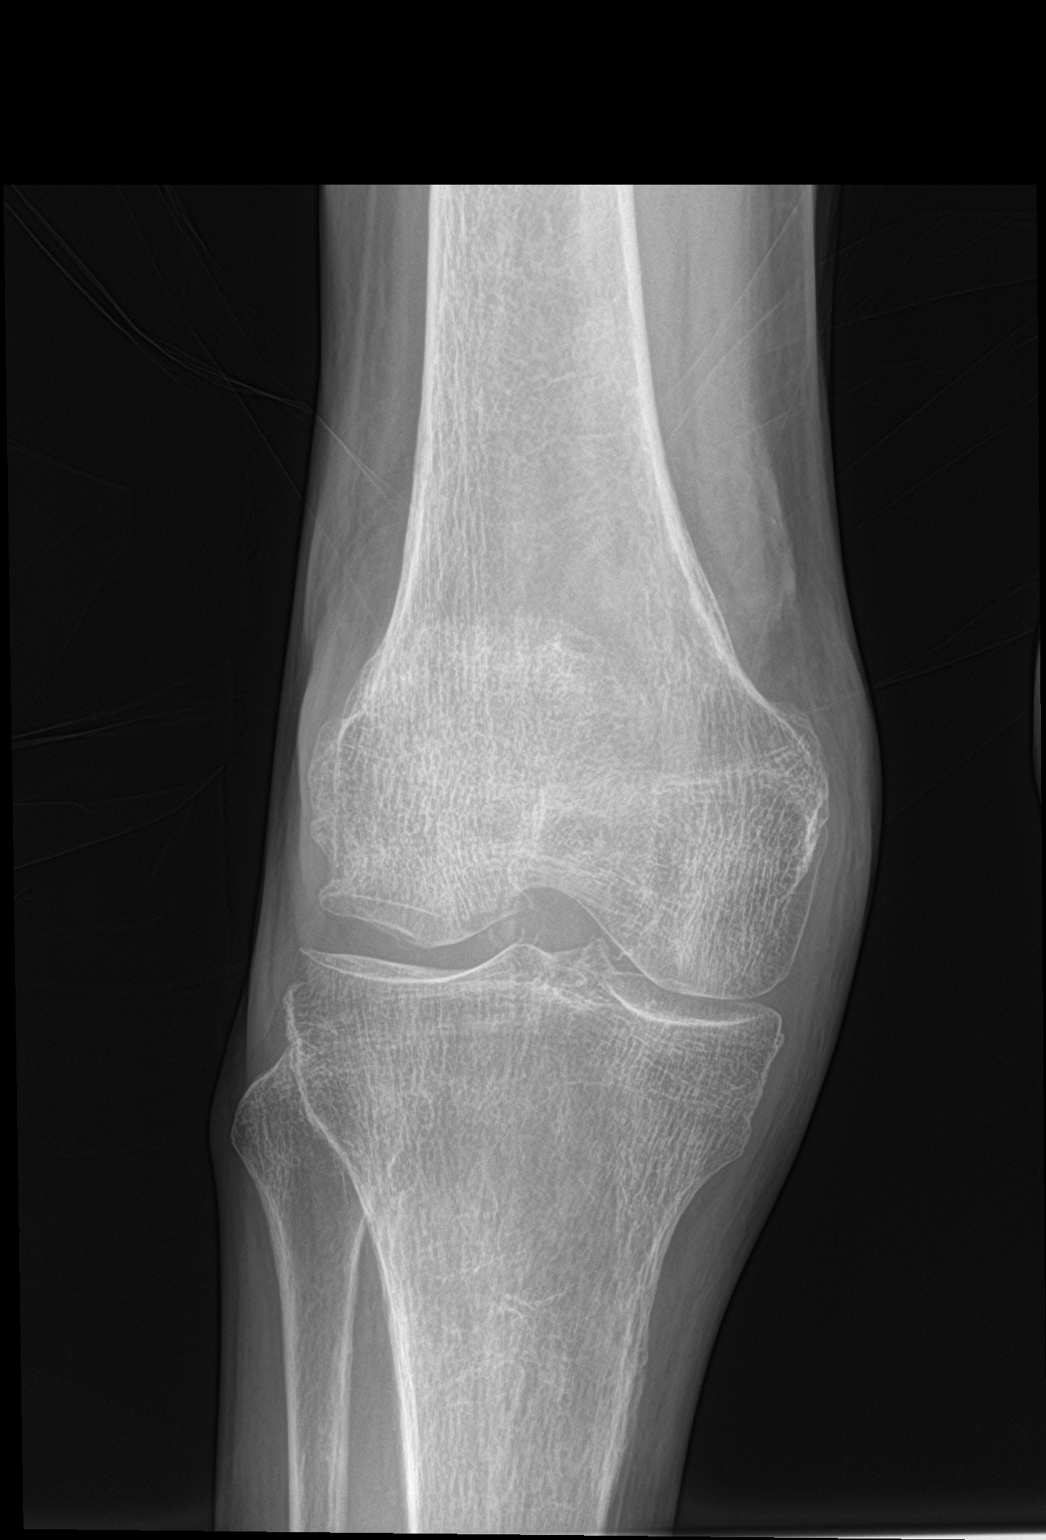

[knee lat]
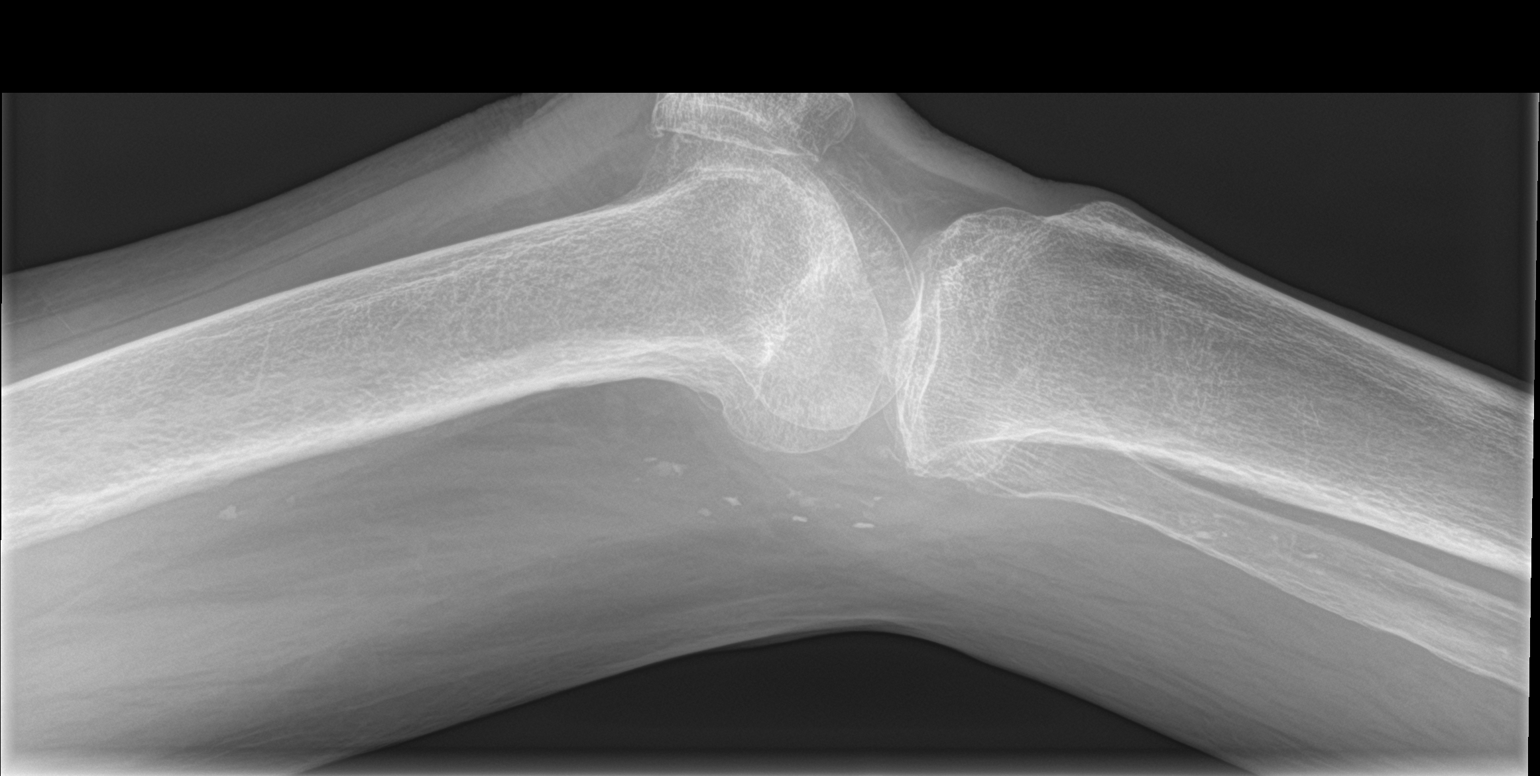

[2 of 2 positions shown; findings below may reference images not displayed]

FINDINGS: There is no acute fracture or dislocation. The bones are osteopenic.
There is a small suprapatellar effusion. Mild arthritic changes of
the knee. The soft tissues are unremarkable. Vascular calcification.
IMPRESSION: 1. No acute fracture or dislocation.
2. Small suprapatellar effusion.
